# Patient Record
Sex: Female | Born: 1940 | Race: Asian | Hispanic: No | Smoking: Former smoker
Health system: Southern US, Community
[De-identification: ages and names within clinical notes are randomized; demographics above are authoritative.]

## PROBLEM LIST (undated history)

## (undated) DIAGNOSIS — C539 Malignant neoplasm of cervix uteri, unspecified: Secondary | ICD-10-CM

## (undated) HISTORY — PX: BREAST LUMPECTOMY: SHX2

## (undated) HISTORY — DX: Malignant neoplasm of cervix uteri, unspecified: C53.9

---

## 2009-01-15 ENCOUNTER — Ambulatory Visit: Payer: Self-pay | Admitting: Internal Medicine

## 2009-02-17 ENCOUNTER — Ambulatory Visit: Payer: Self-pay | Admitting: Family Medicine

## 2009-03-08 ENCOUNTER — Emergency Department: Payer: Self-pay | Admitting: Emergency Medicine

## 2010-12-11 DIAGNOSIS — I10 Essential (primary) hypertension: Secondary | ICD-10-CM | POA: Insufficient documentation

## 2012-09-03 ENCOUNTER — Ambulatory Visit: Payer: Self-pay | Admitting: Family Medicine

## 2013-01-14 DIAGNOSIS — E782 Mixed hyperlipidemia: Secondary | ICD-10-CM | POA: Insufficient documentation

## 2013-02-09 ENCOUNTER — Ambulatory Visit: Payer: Self-pay | Admitting: Physician Assistant

## 2013-02-09 LAB — URINALYSIS, COMPLETE
Glucose,UR: NEGATIVE mg/dL (ref 0–75)
Nitrite: POSITIVE
Protein: 100

## 2013-02-11 LAB — URINE CULTURE

## 2014-04-26 ENCOUNTER — Emergency Department: Payer: Self-pay | Admitting: Emergency Medicine

## 2016-10-23 DIAGNOSIS — Z170 Estrogen receptor positive status [ER+]: Secondary | ICD-10-CM | POA: Insufficient documentation

## 2016-10-23 DIAGNOSIS — Z853 Personal history of malignant neoplasm of breast: Secondary | ICD-10-CM

## 2017-04-03 DIAGNOSIS — H4089 Other specified glaucoma: Secondary | ICD-10-CM | POA: Insufficient documentation

## 2017-10-09 DIAGNOSIS — Z79811 Long term (current) use of aromatase inhibitors: Secondary | ICD-10-CM | POA: Insufficient documentation

## 2018-01-27 DIAGNOSIS — H34812 Central retinal vein occlusion, left eye, with macular edema: Secondary | ICD-10-CM | POA: Insufficient documentation

## 2018-04-04 DIAGNOSIS — R9431 Abnormal electrocardiogram [ECG] [EKG]: Secondary | ICD-10-CM | POA: Insufficient documentation

## 2019-03-06 DIAGNOSIS — N184 Chronic kidney disease, stage 4 (severe): Secondary | ICD-10-CM | POA: Insufficient documentation

## 2019-06-26 ENCOUNTER — Other Ambulatory Visit: Payer: Self-pay | Admitting: Nephrology

## 2019-06-26 DIAGNOSIS — N184 Chronic kidney disease, stage 4 (severe): Secondary | ICD-10-CM

## 2019-07-03 ENCOUNTER — Encounter (INDEPENDENT_AMBULATORY_CARE_PROVIDER_SITE_OTHER): Payer: Self-pay

## 2019-07-03 ENCOUNTER — Ambulatory Visit
Admission: RE | Admit: 2019-07-03 | Discharge: 2019-07-03 | Disposition: A | Discharge status: Home / Self Care | Payer: Medicare Other | Source: Ambulatory Visit | Attending: Nephrology | Admitting: Nephrology

## 2019-07-03 ENCOUNTER — Other Ambulatory Visit: Payer: Self-pay

## 2019-07-03 DIAGNOSIS — N184 Chronic kidney disease, stage 4 (severe): Secondary | ICD-10-CM

## 2019-08-18 NOTE — Progress Notes (Signed)
[facility]  [address] [place], [place] [address] Phone: [phone]  Fax: [phone]   Clinic Day:  08/19/2019  Referring physician: Anthonette Legato, MD  Chief Complaint: Kelly Pugh is a 79 y.o. female with stage IV chronic disease and an abnormal SPEP and UPEP who is referred in consultation by Dr. Anthonette Legato for assessment and management.   HPI: The patient has stage IV chronic kidney disease, anemia, and a monoclonal gammopathy.  She was initially seen by Dr Holley Raring in consultation on 06/25/2019.  Creatinine was 2.2 on 10/13/2018 and 2.1 on 04/03/2019.  GFR was 24-25 ml/min.  Risk factors for CKD included age, hypertension, and possible NSAID use.  Renal ultrasound on 07/03/2019 revealed diffusely increased renal cortical echogenicity compatible with medical renal disease. There were small simple appearing cysts in both kidneys.  SPEP on 06/25/2019 revealed a restricted band (M-spike) migrating in the gamma globulin region. UPEP on 06/25/2019 revealed two abnormal protein bands in the gamma globulins that may represent monoclonal immunoglobulins and/or light chains.   CBC has been followed: 10/13/2018: Hematocrit 32.2, hemoglobin 10.2, MCV 87.0, platelets 282,000, WBC 6,500. 04/03/2019: Hematocrit 34.8, hemoglobin 10.9, MCV 90.0, platelets 327,000, WBC 8,300. 06/25/2019: Hematocrit 32.4, hemoglobin 10.5, MCV 83.3, platelets 303,000, WBC 7,800. 08/05/2019: Hematocrit 34.2, hemoglobin 10.9, MCV 83.2, platelets 342,000, WBC 7,300.  Iron studies have been followed: 10/13/2018: Ferritin 214. Iron saturation 23%. TIBC 249. 04/03/2019: Ferritin 170  06/25/2019: Ferritin 160. Iron saturation 16%. TIBC 263.  Symptomatically, she feels "good."  She denies B symptoms, headaches, changes in vision, runny nose, sore throat, cough, shortness of breath, chest pain, palpitations, or abdominal symptoms. The patient's daughter says that she does not  have very good short term memory. She received both of her COVID vaccines a couple of months ago.  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 She had a lumpectomy and radiation therapy (12/31/2016-01/30/2017).  Oncotype DX score was 6.  Chemotherapy was not recommended. She has been on letrozole since 02/15/2017.  She is being followed by Dr. Genevieve Norlander at [facility].  The patient has a remote history of stage IIIC ovarian cancer.  She was diagnosed and treated by Kelly Pugh in 2005.  According to the patient's daughter, she has never had genetic testing.  The patient also has a history of central retinal vein occlusion. When asked about this and her history of cancer, the patient did not remember any of it happening.  One of the patient's daughter's had lymphoma in 2014 and the other had renal cell carcinoma and had a kidney removed.   Past Medical History:  Diagnosis Date  . Cervical cancer [facility])     Past Surgical History:  Procedure Laterality Date  . BREAST LUMPECTOMY      History reviewed. No pertinent family history.  Social History:  reports that she has quit smoking. Her smoking use included cigarettes. She has never used smokeless tobacco. She reports that she does not drink alcohol and does not use drugs. She denies alcohol and tobacco use. She worked at [employer]. Denies exposure to radiation or toxins. The patient lives in [place] with her two daughters. The patient is accompanied by her daughter, Kelly Pugh, today.  Allergies:  Allergies  Allergen Reactions  . Hydrocodone-Acetaminophen Nausea Only  . Oxycodone-Acetaminophen Nausea Only    Current Medications: Current Outpatient Medications  Medication Sig Dispense Refill  . albuterol (VENTOLIN HFA) 108 (90  Base) MCG/ACT inhaler Inhale into the lungs.    Marland Kitchen amLODipine (NORVASC) 10 MG tablet Take by mouth.    Marland Kitchen atorvastatin (LIPITOR) 10 MG tablet Take by  mouth.    . brimonidine (ALPHAGAN P) 0.1 % SOLN Apply to eye.    . dorzolamide-timolol (COSOPT) 22.3-6.8 MG/ML ophthalmic solution INSTILL 1 DROP INTO EACH EYE TWICE DAILY    . fluticasone (FLOVENT HFA) 110 MCG/ACT inhaler Inhale into the lungs.    . hydrochlorothiazide (HYDRODIURIL) 12.5 MG tablet Frequency:SEEESIG   Dosage:0.0     Instructions:  Note:Dose: UNKNOWN    . latanoprost (XALATAN) 0.005 % ophthalmic solution Apply to eye.    Marland Kitchen letrozole (FEMARA) 2.5 MG tablet Take by mouth.    . losartan (COZAAR) 25 MG tablet Take by mouth.    Marland Kitchen aspirin 81 MG chewable tablet Frequency:OTHER   Dosage:0.0     Instructions:  Note:Dose: UNKNOWN (Patient not taking: Reported on 08/19/2019)    . glimepiride (AMARYL) 1 MG tablet  (Patient not taking: Reported on 08/19/2019)     No current facility-administered medications for this visit.    Review of Systems  Constitutional: Negative for chills, diaphoresis, fever, malaise/fatigue and weight loss.       Feels "good."  HENT: Negative for congestion, ear discharge, ear pain, hearing loss, nosebleeds, sinus pain, sore throat and tinnitus.   Eyes: Negative for blurred vision.  Respiratory: Negative for cough, hemoptysis, sputum production and shortness of breath.   Cardiovascular: Negative for chest pain, palpitations and leg swelling.  Gastrointestinal: Negative for abdominal pain, blood in stool, constipation, diarrhea, heartburn, melena, nausea and vomiting.  Genitourinary: Negative for dysuria, frequency, hematuria and urgency.  Musculoskeletal: Negative for back pain, joint pain, myalgias and neck pain.  Skin: Negative for itching and rash.  Neurological: Negative for dizziness, tingling, sensory change, weakness and headaches.  Endo/Heme/Allergies: Does not bruise/bleed easily.  Psychiatric/Behavioral: Positive for memory loss. Negative for depression. The patient is not nervous/anxious and does not have insomnia.   All other systems reviewed and are  negative.  Performance status (ECOG): 1  Vitals Blood pressure 140/71, pulse 95, temperature 97.6 F (36.4 C), temperature source Tympanic, resp. rate 18, weight 138 lb 14.2 oz (63 kg), SpO2 100 %.   Physical Exam Vitals and nursing note reviewed.  Constitutional:      General: She is not in acute distress.    Appearance: She is not diaphoretic.  HENT:     Head: Normocephalic and atraumatic.     Comments: Short gray hair.    Mouth/Throat:     Mouth: Mucous membranes are moist.     Pharynx: Oropharynx is clear.  Eyes:     General: No scleral icterus.    Extraocular Movements: Extraocular movements intact.     Conjunctiva/sclera: Conjunctivae normal.     Pupils: Pupils are equal, round, and reactive to light.  Cardiovascular:     Rate and Rhythm: Normal rate and regular rhythm.     Pulses: Normal pulses.     Heart sounds: Normal heart sounds. No murmur heard.   Pulmonary:     Effort: Pulmonary effort is normal. No respiratory distress.     Breath sounds: Normal breath sounds. No wheezing or rales.  Chest:     Chest wall: No tenderness.  Abdominal:     General: Bowel sounds are normal. There is no distension.     Palpations: Abdomen is soft. There is no mass.     Tenderness: There is no abdominal  tenderness. There is no guarding or rebound.  Musculoskeletal:        General: No swelling or tenderness. Normal range of motion.     Cervical back: Normal range of motion and neck supple.  Lymphadenopathy:     Head:     Right side of head: No preauricular, posterior auricular or occipital adenopathy.     Left side of head: No preauricular, posterior auricular or occipital adenopathy.     Cervical: No cervical adenopathy.     Upper Body:     Right upper body: No supraclavicular or axillary adenopathy.     Left upper body: No supraclavicular or axillary adenopathy.     Lower Body: No right inguinal adenopathy. No left inguinal adenopathy.  Skin:    General: Skin is warm and  dry.  Neurological:     Mental Status: She is alert and oriented to person, place, and time. Mental status is at baseline.  Psychiatric:        Mood and Affect: Mood normal.        Behavior: Behavior normal.        Thought Content: Thought content normal.        Judgment: Judgment normal.     No visits with results within 3 Day(s) from this visit.  Latest known visit with results is:  No results found for any previous visit.    Assessment:  LEQUITA MEADOWCROFT is a 79 y.o. female with stage IV chronic kidney disease and a monoclonal gammopathy.    SPEP on 06/25/2019 revealed a restricted band (M-spike) migrating in the gamma globulin region.  UPEP on 06/25/2019 revealed two abnormal protein bands in  the gamma globulins that may represent monoclonal immunoglobulins and/or light chains.   She has a normocytic anemia.  Hematocrit was 34.2, hemoglobin 10.9, MCV 83.2, platelets 342,000, WBC 7,300 on 08/05/2019.  Ferritin was 160 with an iron saturation of 16% and a TIBC 263 on 06/25/2019.  She has a history of stage IA (T1bN0M0) ER/PR+, HER2- right breast cancer s/p lumpectomy on 11/09/2016 followed by radiation (12/31/2016 - 01/30/2017).  Oncotype DX score was 6.  Chemotherapy was not recommended. She has been on letrozole since 02/15/2017.  She has a remote history of stage IIIC ovarian cancer s/p TAH BSO, partial omentectomy and radical resection in 09/2003.  She was diagnosed and treated by [facility].  She has never had genetic testing.  She received both of her COVID vaccines a couple of months ago.  Symptomatically, she feels good.  She denies any fevers, sweats or weight loss.  She denies any bone pain or issues with infections.  Exam reveals no adenopath or hepatosplenomegaly.  Plan: 1.   Labs today:  CBC with diff, BMP, LDH, beta2-microglobulin, myeloma panel, free light chain assay. 2.   24 hour urine for UPEP and free light chains 3.   Abnormal SPEP and  UPEP  Etiology is unclear.  Discuss differential diagnosis including monoclonal gammopathy of unknown significance (MGUS) and a lymphoplasmacytic disorder.  Discuss sending additional labs and 24 hour urine for testing. 4.   RTC in 2 weeks for MD assessment, review of work-up and discussion regarding direction of therapy.  I discussed the assessment and treatment plan with the patient.  The patient was provided an opportunity to ask questions and all were answered.  The patient agreed with the plan and demonstrated an understanding of the instructions.  The patient was advised to call back if the symptoms worsen or  if the condition fails to improve as anticipated.  I provided 22 minutes of face-to-face time during this encounter and > 50% was spent counseling as documented under my assessment and plan.  An additional 20 minutes were spent reviewing her chart (Epic and Care Everywhere) including notes, labs, and imaging studies.    Chanise Habeck C. Mike Gip, MD, PhD    08/19/2019, 3:16 PM  I, Mirian Mo Tufford, am acting as Education administrator for [employer]. Mike Gip, MD, PhD.  I, Josselin Gaulin C. Mike Gip, MD, have reviewed the above documentation for accuracy and completeness, and I agree with the above.

## 2019-08-19 ENCOUNTER — Inpatient Hospital Stay: Payer: Medicare Other

## 2019-08-19 ENCOUNTER — Inpatient Hospital Stay: Payer: Medicare Other | Attending: Hematology and Oncology | Admitting: Hematology and Oncology

## 2019-08-19 ENCOUNTER — Encounter: Payer: Self-pay | Admitting: Hematology and Oncology

## 2019-08-19 ENCOUNTER — Other Ambulatory Visit: Payer: Self-pay

## 2019-08-19 VITALS — BP 140/71 | HR 95 | Temp 97.6°F | Resp 18 | Wt 138.9 lb

## 2019-08-19 DIAGNOSIS — Z7984 Long term (current) use of oral hypoglycemic drugs: Secondary | ICD-10-CM | POA: Diagnosis not present

## 2019-08-19 DIAGNOSIS — Z8541 Personal history of malignant neoplasm of cervix uteri: Secondary | ICD-10-CM | POA: Insufficient documentation

## 2019-08-19 DIAGNOSIS — Z923 Personal history of irradiation: Secondary | ICD-10-CM | POA: Diagnosis not present

## 2019-08-19 DIAGNOSIS — Z7982 Long term (current) use of aspirin: Secondary | ICD-10-CM | POA: Insufficient documentation

## 2019-08-19 DIAGNOSIS — Z90710 Acquired absence of both cervix and uterus: Secondary | ICD-10-CM | POA: Insufficient documentation

## 2019-08-19 DIAGNOSIS — Z7951 Long term (current) use of inhaled steroids: Secondary | ICD-10-CM | POA: Insufficient documentation

## 2019-08-19 DIAGNOSIS — Z79811 Long term (current) use of aromatase inhibitors: Secondary | ICD-10-CM | POA: Diagnosis not present

## 2019-08-19 DIAGNOSIS — D472 Monoclonal gammopathy: Secondary | ICD-10-CM | POA: Diagnosis present

## 2019-08-19 DIAGNOSIS — C50911 Malignant neoplasm of unspecified site of right female breast: Secondary | ICD-10-CM | POA: Diagnosis not present

## 2019-08-19 DIAGNOSIS — Z807 Family history of other malignant neoplasms of lymphoid, hematopoietic and related tissues: Secondary | ICD-10-CM | POA: Insufficient documentation

## 2019-08-19 DIAGNOSIS — R778 Other specified abnormalities of plasma proteins: Secondary | ICD-10-CM

## 2019-08-19 DIAGNOSIS — Z8051 Family history of malignant neoplasm of kidney: Secondary | ICD-10-CM | POA: Insufficient documentation

## 2019-08-19 DIAGNOSIS — Z170 Estrogen receptor positive status [ER+]: Secondary | ICD-10-CM | POA: Insufficient documentation

## 2019-08-19 DIAGNOSIS — E119 Type 2 diabetes mellitus without complications: Secondary | ICD-10-CM | POA: Insufficient documentation

## 2019-08-19 DIAGNOSIS — Z9079 Acquired absence of other genital organ(s): Secondary | ICD-10-CM | POA: Diagnosis not present

## 2019-08-19 DIAGNOSIS — D649 Anemia, unspecified: Secondary | ICD-10-CM | POA: Insufficient documentation

## 2019-08-19 DIAGNOSIS — N184 Chronic kidney disease, stage 4 (severe): Secondary | ICD-10-CM | POA: Insufficient documentation

## 2019-08-19 DIAGNOSIS — Z90722 Acquired absence of ovaries, bilateral: Secondary | ICD-10-CM | POA: Diagnosis not present

## 2019-08-19 DIAGNOSIS — Z79899 Other long term (current) drug therapy: Secondary | ICD-10-CM | POA: Diagnosis not present

## 2019-08-19 DIAGNOSIS — Z87891 Personal history of nicotine dependence: Secondary | ICD-10-CM | POA: Insufficient documentation

## 2019-08-19 LAB — CBC WITH DIFFERENTIAL/PLATELET
Abs Immature Granulocytes: 0.02 10*3/uL (ref 0.00–0.07)
Basophils Absolute: 0 10*3/uL (ref 0.0–0.1)
Basophils Relative: 1 %
Eosinophils Absolute: 0.2 10*3/uL (ref 0.0–0.5)
Eosinophils Relative: 3 %
HCT: 30.3 % — ABNORMAL LOW (ref 36.0–46.0)
Hemoglobin: 9.9 g/dL — ABNORMAL LOW (ref 12.0–15.0)
Immature Granulocytes: 0 %
Lymphocytes Relative: 33 %
Lymphs Abs: 2 10*3/uL (ref 0.7–4.0)
MCH: 27.2 pg (ref 26.0–34.0)
MCHC: 32.7 g/dL (ref 30.0–36.0)
MCV: 83.2 fL (ref 80.0–100.0)
Monocytes Absolute: 0.4 10*3/uL (ref 0.1–1.0)
Monocytes Relative: 7 %
Neutro Abs: 3.4 10*3/uL (ref 1.7–7.7)
Neutrophils Relative %: 56 %
Platelets: 303 10*3/uL (ref 150–400)
RBC: 3.64 MIL/uL — ABNORMAL LOW (ref 3.87–5.11)
RDW: 14 % (ref 11.5–15.5)
WBC: 6.1 10*3/uL (ref 4.0–10.5)
nRBC: 0 % (ref 0.0–0.2)

## 2019-08-19 LAB — BASIC METABOLIC PANEL
Anion gap: 9 (ref 5–15)
BUN: 20 mg/dL (ref 8–23)
CO2: 26 mmol/L (ref 22–32)
Calcium: 9 mg/dL (ref 8.9–10.3)
Chloride: 102 mmol/L (ref 98–111)
Creatinine, Ser: 2.01 mg/dL — ABNORMAL HIGH (ref 0.44–1.00)
GFR calc Af Amer: 26 mL/min — ABNORMAL LOW (ref >60)
GFR calc non Af Amer: 23 mL/min — ABNORMAL LOW (ref >60)
Glucose, Bld: 92 mg/dL (ref 70–99)
Potassium: 4.6 mmol/L (ref 3.5–5.1)
Sodium: 137 mmol/L (ref 135–145)

## 2019-08-19 LAB — LACTATE DEHYDROGENASE: LDH: 137 U/L (ref 98–192)

## 2019-08-19 NOTE — Progress Notes (Signed)
One fall out of the shower recently, no injuries. Sleep a lot during the day.

## 2019-08-20 LAB — KAPPA/LAMBDA LIGHT CHAINS
Kappa free light chain: 117.5 mg/L — ABNORMAL HIGH (ref 3.3–19.4)
Kappa, lambda light chain ratio: 3.11 — ABNORMAL HIGH (ref 0.26–1.65)
Lambda free light chains: 37.8 mg/L — ABNORMAL HIGH (ref 5.7–26.3)

## 2019-08-20 LAB — BETA 2 MICROGLOBULIN, SERUM: Beta-2 Microglobulin: 3.6 mg/L — ABNORMAL HIGH (ref 0.6–2.4)

## 2019-08-21 LAB — MULTIPLE MYELOMA PANEL, SERUM
Albumin SerPl Elph-Mcnc: 3.2 g/dL (ref 2.9–4.4)
Albumin/Glob SerPl: 0.8 (ref 0.7–1.7)
Alpha 1: 0.3 g/dL (ref 0.0–0.4)
Alpha2 Glob SerPl Elph-Mcnc: 1 g/dL (ref 0.4–1.0)
B-Globulin SerPl Elph-Mcnc: 1 g/dL (ref 0.7–1.3)
Gamma Glob SerPl Elph-Mcnc: 1.8 g/dL (ref 0.4–1.8)
Globulin, Total: 4.1 g/dL — ABNORMAL HIGH (ref 2.2–3.9)
IgA: 307 mg/dL (ref 64–422)
IgG (Immunoglobin G), Serum: 1991 mg/dL — ABNORMAL HIGH (ref 586–1602)
IgM (Immunoglobulin M), Srm: 21 mg/dL — ABNORMAL LOW (ref 26–217)
M Protein SerPl Elph-Mcnc: 1.2 g/dL — ABNORMAL HIGH
Total Protein ELP: 7.3 g/dL (ref 6.0–8.5)

## 2019-08-24 NOTE — Addendum Note (Signed)
Addended by: Nolon Stalls C on: 08/24/2019 10:29 PM   Modules accepted: Level of Service

## 2019-09-06 NOTE — Progress Notes (Signed)
[facility]  [address] [place], [place] [address] Phone: [phone]  Fax: [phone]   Clinic Day:  09/07/2019  Referring physician: Ulyess Blossom, PA  Chief Complaint: Kelly Pugh is a 79 y.o. female with stage IV chronic kidney disease and a monoclonal gammopathy who is seen for review of work up and discussion regarding direction of therapy   HPI: The patient was last seen in the medical oncology clinic on 08/19/2019 for an initial consult.  At that time, she felt good. She denied any fevers, sweats or weight loss. She denied any bone pain or issues with infections.  Exam revealed no adenopathy or hepatosplenomegaly.   Work up included hematocrit 30.3, hemoglobin 9.9, platelets 303,000, WBC 6,100. Creatinine was 2.01 and calcium 9.0.  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  IgG was 1,991. Kappa free light chains were 117.5,  lambda free light chains 37.8 and ratio 3.11 (0.26-1.65).  Beta-2 microglobulin was 3.6 (0.6-2.4) . LDH was 137 (98-192).   24 hour urine for UPEP and free light chains is pending.  During the interim, the patient has felt "good".  Kelly Pugh, her daughter, reports that the 24 hour urine did not work out. Kelly Pugh states her mother was not using the hat properly. She feels like her mother would forget to use the hat due to her short term memory loss.  She declines repeat testing.  Patient and daughter agreed to a bone marrow biopsy and bone survey to r/o myeloma.    Past Medical History:  Diagnosis Date  . Cervical cancer [facility])     Past Surgical History:  Procedure Laterality Date  . BREAST LUMPECTOMY      History reviewed. No pertinent family history.  Social History:  reports that she has quit smoking. Her smoking use included cigarettes. She has never used smokeless tobacco. She reports that she does not drink alcohol and does not use drugs. She denies alcohol  and tobacco use. She worked at [employer]. Denies exposure to radiation or toxins. The patient lives in [place] with her two daughters. The patient is accompanied by her daughter, Kelly Pugh, today via iPad.  Allergies:  Allergies  Allergen Reactions  . Hydrocodone-Acetaminophen Nausea Only  . Oxycodone-Acetaminophen Nausea Only    Current Medications: Current Outpatient Medications  Medication Sig Dispense Refill  . albuterol (VENTOLIN HFA) 108 (90 Base) MCG/ACT inhaler Inhale into the lungs.    Marland Kitchen amLODipine (NORVASC) 10 MG tablet Take by mouth.    Marland Kitchen atorvastatin (LIPITOR) 10 MG tablet Take by mouth.    . brimonidine (ALPHAGAN P) 0.1 % SOLN Apply to eye.    . dorzolamide-timolol (COSOPT) 22.3-6.8 MG/ML ophthalmic solution INSTILL 1 DROP INTO EACH EYE TWICE DAILY    . fluticasone (FLOVENT HFA) 110 MCG/ACT inhaler Inhale into the lungs.    . hydrochlorothiazide (HYDRODIURIL) 12.5 MG tablet Frequency:SEEESIG   Dosage:0.0     Instructions:  Note:Dose: UNKNOWN    . latanoprost (XALATAN) 0.005 % ophthalmic solution Apply to eye.    Marland Kitchen letrozole (FEMARA) 2.5 MG tablet Take by mouth.    . losartan (COZAAR) 25 MG tablet Take by mouth.    Marland Kitchen aspirin 81 MG chewable tablet Frequency:OTHER   Dosage:0.0     Instructions:  Note:Dose: UNKNOWN (Patient not taking: Reported on 08/19/2019)    . glimepiride (AMARYL) 1 MG tablet  (Patient not taking: Reported on 08/19/2019)     No current facility-administered medications  for this visit.    Review of Systems  Constitutional: Negative for chills, diaphoresis, fever, malaise/fatigue and weight loss (stable).       Feels "good".  HENT: Negative for congestion, ear discharge, ear pain, hearing loss, nosebleeds, sinus pain, sore throat and tinnitus.   Eyes: Negative for blurred vision.  Respiratory: Negative for cough, hemoptysis, sputum production and shortness of breath.   Cardiovascular: Negative for chest pain, palpitations and leg swelling.  Gastrointestinal:  Negative for abdominal pain, blood in stool, constipation, diarrhea, heartburn, melena, nausea and vomiting.  Genitourinary: Negative for dysuria, frequency, hematuria and urgency.  Musculoskeletal: Negative for back pain, joint pain, myalgias and neck pain.  Skin: Negative for itching and rash.  Neurological: Negative for dizziness, tingling, sensory change, weakness and headaches.  Endo/Heme/Allergies: Does not bruise/bleed easily.  Psychiatric/Behavioral: Positive for memory loss (short term). Negative for depression. The patient is not nervous/anxious and does not have insomnia.   All other systems reviewed and are negative.  Performance status (ECOG): 1  Vitals Blood pressure 130/66, pulse 95, temperature 98.2 F (36.8 C), temperature source Tympanic, weight 138 lb 10.7 oz (62.9 kg), SpO2 99 %.   Physical Exam Vitals and nursing note reviewed.  Constitutional:      General: She is not in acute distress.    Appearance: She is not diaphoretic.     Interventions: Face mask in place.  HENT:     Head: Normocephalic and atraumatic.     Comments: Short gray hair. Eyes:     General: No scleral icterus.    Conjunctiva/sclera: Conjunctivae normal.  Neurological:     Mental Status: She is alert and oriented to person, place, and time. Mental status is at baseline.  Psychiatric:        Mood and Affect: Mood normal.        Behavior: Behavior normal.        Thought Content: Thought content normal.        Judgment: Judgment normal.    No visits with results within 3 Day(s) from this visit.  Latest known visit with results is:  Appointment on 08/19/2019  Component Date Value Ref Range Status  . Beta-2 Microglobulin 08/19/2019 3.6* 0.6 - 2.4 mg/L Final   Comment: (NOTE) Siemens Immulite 2000 Immunochemiluminometric assay (ICMA) Values obtained with different assay methods or kits cannot be used interchangeably. Results cannot be interpreted as absolute evidence of the presence or  absence of malignant disease. Performed At: [facility] [place], [place] [id number] Rush Farmer MD PJ:[id number]   . LDH 08/19/2019 137  98 - 192 U/L Final   Performed at [facility], [address], [place], [place] [address]  . Kappa free light chain 08/19/2019 117.5* 3.3 - 19.4 mg/L Final  . Lamda free light chains 08/19/2019 37.8* 5.7 - 26.3 mg/L Final  . Kappa, lamda light chain ratio 08/19/2019 3.11* 0.26 - 1.65 Final   Comment: (NOTE) Performed At: [facility] [place], [place] [id number] Rush Farmer MD OI:[id number]   . IgG (Immunoglobin G), Serum 08/19/2019 1,991* 586 - 1,602 mg/dL Final  . IgA 08/19/2019 307  64 - 422 mg/dL Final  . IgM (Immunoglobulin M), Srm 08/19/2019 21* 26 - 217 mg/dL Final   Result confirmed on concentration.  . Total Protein ELP 08/19/2019 7.3  6.0 - 8.5 g/dL Corrected  . Albumin SerPl Elph-Mcnc 08/19/2019 3.2  2.9 - 4.4 g/dL Corrected  . Alpha 1 08/19/2019 0.3  0.0 -  0.4 g/dL Corrected  . Alpha2 Glob SerPl Elph-Mcnc 08/19/2019 1.0  0.4 - 1.0 g/dL Corrected  . B-Globulin SerPl Elph-Mcnc 08/19/2019 1.0  0.7 - 1.3 g/dL Corrected  . Gamma Glob SerPl Elph-Mcnc 08/19/2019 1.8  0.4 - 1.8 g/dL Corrected  . M Protein SerPl Elph-Mcnc 08/19/2019 1.2* Not Observed g/dL Corrected  . Globulin, Total 08/19/2019 4.1* 2.2 - 3.9 g/dL Corrected  . Albumin/Glob SerPl 08/19/2019 0.8  0.7 - 1.7 Corrected  . IFE 1 08/19/2019 Comment*  Corrected   Comment: (NOTE) Immunofixation shows IgG monoclonal protein with kappa light chain specificity. Please note that samples from patients receiving DARZALEX(R) (daratumumab) treatment can appear as an "IgG kappa" and mask a complete response. If this patient is receiving DARA, this IFE assay interference can be removed by ordering test number [id number]-"Immunofixation, Daratumumab-Specific, Serum" and submitting a new sample for testing or by calling the lab to add  this test to the current sample.   . Please Note 08/19/2019 Comment   Corrected   Comment: (NOTE) Protein electrophoresis scan will follow via computer, mail, or courier delivery. Performed At: [facility] [place], [place] [id number] Rush Farmer MD [id number]   . Sodium 08/19/2019 137  135 - 145 mmol/L Final  . Potassium 08/19/2019 4.6  3.5 - 5.1 mmol/L Final  . Chloride 08/19/2019 102  98 - 111 mmol/L Final  . CO2 08/19/2019 26  22 - 32 mmol/L Final  . Glucose, Bld 08/19/2019 92  70 - 99 mg/dL Final   Glucose reference range applies only to samples taken after fasting for at least 8 hours.  . BUN 08/19/2019 20  8 - 23 mg/dL Final  . Creatinine, Ser 08/19/2019 2.01* 0.44 - 1.00 mg/dL Final  . Calcium 08/19/2019 9.0  8.9 - 10.3 mg/dL Final  . GFR calc non Af Amer 08/19/2019 23* >60 mL/min Final  . GFR calc Af Amer 08/19/2019 26* >60 mL/min Final  . Anion gap 08/19/2019 9  5 - 15 Final   Performed at [facility], [address] [place], [place] [address]  . WBC 08/19/2019 6.1  4.0 - 10.5 K/uL Final  . RBC 08/19/2019 3.64* 3.87 - 5.11 MIL/uL Final  . Hemoglobin 08/19/2019 9.9* 12.0 - 15.0 g/dL Final  . HCT 08/19/2019 30.3* 36 - 46 % Final  . MCV 08/19/2019 83.2  80.0 - 100.0 fL Final  . MCH 08/19/2019 27.2  26.0 - 34.0 pg Final  . MCHC 08/19/2019 32.7  30.0 - 36.0 g/dL Final  . RDW 08/19/2019 14.0  11.5 - 15.5 % Final  . Platelets 08/19/2019 303  150 - 400 K/uL Final  . nRBC 08/19/2019 0.0  0.0 - 0.2 % Final  . Neutrophils Relative % 08/19/2019 56  % Final  . Neutro Abs 08/19/2019 3.4  1.7 - 7.7 K/uL Final  . Lymphocytes Relative 08/19/2019 33  % Final  . Lymphs Abs 08/19/2019 2.0  0.7 - 4.0 K/uL Final  . Monocytes Relative 08/19/2019 7  % Final  . Monocytes Absolute 08/19/2019 0.4  0 - 1 K/uL Final  . Eosinophils Relative 08/19/2019 3  % Final  . Eosinophils Absolute 08/19/2019 0.2  0 - 0 K/uL Final  . Basophils Relative 08/19/2019  1  % Final  . Basophils Absolute 08/19/2019 0.0  0 - 0 K/uL Final  . Immature Granulocytes 08/19/2019 0  % Final  . Abs Immature Granulocytes 08/19/2019 0.02  0.00 - 0.07 K/uL Final   Performed at [facility], [address] [place], [place] [address]    Assessment:  KALEN NEIDERT is a 79 y.o. female with stage IV chronic kidney disease and a monoclonal gammopathy.    SPEP on 06/25/2019 revealed a restricted band (M-spike) migrating in the gamma globulin region.  UPEP on 06/25/2019 revealed two abnormal protein bands in  the gamma globulins that may represent monoclonal immunoglobulins and/or light chains.   Work up on 08/19/2019 included hematocrit 30.3, hemoglobin 9.9, platelets 303,000, WBC 6,100. Creatinine was 2.01 and calcium 9.0.  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  IgG was 1,991. Kappa free light chains were 117.5,  lambda free light chains 37.8 and ratio 3.11 (0.26-1.65).  Beta-2 microglobulin was 3.6 (0.6-2.4) . LDH 137 (98-192).   24 hour urine for UPEP and free light chains was unable to be obtained.  She has a normocytic anemia.  Hematocrit was 34.2, hemoglobin 10.9, MCV 83.2, platelets 342,000, WBC 7,300 on 08/05/2019.  Ferritin was 160 with an iron saturation of 16% and a TIBC 263 on 06/25/2019.  She has a history of stage IA (T1bN0M0) ER/PR+, HER2- right breast cancer s/p lumpectomy on 11/09/2016 followed by radiation (12/31/2016 - 01/30/2017).  Oncotype DX score was 6.  Chemotherapy was not recommended. She has been on letrozole since 02/15/2017.  She has a remote history of stage IIIC ovarian cancer s/p TAH BSO, partial omentectomy and radical resection in 09/2003.  She was diagnosed and treated by [facility].  She has never had genetic testing.  She received both of her COVID vaccines a couple of months ago.  Symptomatically, she feels good.  She has short term memory loss and is unable to  obtain a 24 hour urine.  Hemoglobin is 9.9.  Creatinine is 2.01.  Plan: 1.   Review work up. 2.   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  Kappa light chains are 117.5 with a ratio of 3.11.  Beta-2 microglobulin is 3.6.  She has a normocytic anemia and renal insufficiency.  Calcium is normal.  Discuss obtaining a bone survey to assess for lytic lesions.  She is unable to obtain a 24-hour urine.  Discuss consideration of a bone marrow aspirate and biopsy.  The procedure was discussed in detail.  Risk and benefits were reviewed.   The patient and her daughter agreed to bone marrow biopsy. 3.   Bone survey today. 4.   Bone marrow aspirate and biopsy. 5.   RTC 10 days after bone marrow for MD assessment and review of work-up and discussion regarding direction of therapy.  I discussed the assessment and treatment plan with the patient.  The patient was provided an opportunity to ask questions and all were answered.  The patient agreed with the plan and demonstrated an understanding of the instructions.  The patient was advised to call back if the symptoms worsen or if the condition fails to improve as anticipated.   Melissa C. Mike Gip, MD, PhD    09/07/2019, 3:47 PM  I, Selena Batten, am acting as scribe for [employer]. Mike Gip, MD, PhD.  I, Melissa C. Mike Gip, MD, have reviewed the above documentation for accuracy and completeness, and I agree with the above.

## 2019-09-07 ENCOUNTER — Other Ambulatory Visit: Payer: Self-pay

## 2019-09-07 ENCOUNTER — Ambulatory Visit
Admission: RE | Admit: 2019-09-07 | Discharge: 2019-09-07 | Disposition: A | Discharge status: Home / Self Care | Payer: Medicare Other | Source: Ambulatory Visit | Attending: Hematology and Oncology | Admitting: Hematology and Oncology

## 2019-09-07 ENCOUNTER — Inpatient Hospital Stay: Payer: Medicare Other | Attending: Hematology and Oncology | Admitting: Hematology and Oncology

## 2019-09-07 ENCOUNTER — Ambulatory Visit
Admission: RE | Admit: 2019-09-07 | Discharge: 2019-09-07 | Disposition: A | Discharge status: Home / Self Care | Payer: Medicare Other | Attending: Hematology and Oncology | Admitting: Hematology and Oncology

## 2019-09-07 ENCOUNTER — Encounter: Payer: Self-pay | Admitting: Hematology and Oncology

## 2019-09-07 VITALS — BP 130/66 | HR 95 | Temp 98.2°F | Wt 138.7 lb

## 2019-09-07 DIAGNOSIS — D649 Anemia, unspecified: Secondary | ICD-10-CM | POA: Insufficient documentation

## 2019-09-07 DIAGNOSIS — D472 Monoclonal gammopathy: Secondary | ICD-10-CM | POA: Insufficient documentation

## 2019-09-07 DIAGNOSIS — C50911 Malignant neoplasm of unspecified site of right female breast: Secondary | ICD-10-CM | POA: Insufficient documentation

## 2019-09-07 DIAGNOSIS — N184 Chronic kidney disease, stage 4 (severe): Secondary | ICD-10-CM | POA: Diagnosis not present

## 2019-09-07 DIAGNOSIS — Z79899 Other long term (current) drug therapy: Secondary | ICD-10-CM | POA: Diagnosis not present

## 2019-09-07 DIAGNOSIS — Z7984 Long term (current) use of oral hypoglycemic drugs: Secondary | ICD-10-CM | POA: Diagnosis not present

## 2019-09-07 DIAGNOSIS — Z7982 Long term (current) use of aspirin: Secondary | ICD-10-CM | POA: Diagnosis not present

## 2019-09-07 DIAGNOSIS — Z170 Estrogen receptor positive status [ER+]: Secondary | ICD-10-CM | POA: Diagnosis not present

## 2019-09-07 DIAGNOSIS — Z923 Personal history of irradiation: Secondary | ICD-10-CM | POA: Diagnosis not present

## 2019-09-07 DIAGNOSIS — Z7951 Long term (current) use of inhaled steroids: Secondary | ICD-10-CM | POA: Diagnosis not present

## 2019-09-07 DIAGNOSIS — Z79811 Long term (current) use of aromatase inhibitors: Secondary | ICD-10-CM | POA: Insufficient documentation

## 2019-09-07 DIAGNOSIS — Z90722 Acquired absence of ovaries, bilateral: Secondary | ICD-10-CM | POA: Insufficient documentation

## 2019-09-07 DIAGNOSIS — Z87891 Personal history of nicotine dependence: Secondary | ICD-10-CM | POA: Insufficient documentation

## 2019-09-07 DIAGNOSIS — Z8541 Personal history of malignant neoplasm of cervix uteri: Secondary | ICD-10-CM | POA: Insufficient documentation

## 2019-11-16 ENCOUNTER — Other Ambulatory Visit: Payer: Self-pay

## 2019-11-16 ENCOUNTER — Other Ambulatory Visit: Payer: Self-pay | Admitting: Physician Assistant

## 2019-11-25 ENCOUNTER — Inpatient Hospital Stay
Admission: RE | Admit: 2019-11-25 | Discharge: 2019-11-25 | Disposition: A | Discharge status: Home / Self Care | Payer: Self-pay | Source: Ambulatory Visit | Attending: *Deleted | Admitting: *Deleted

## 2019-11-25 ENCOUNTER — Other Ambulatory Visit: Payer: Self-pay | Admitting: *Deleted

## 2019-11-25 DIAGNOSIS — Z1231 Encounter for screening mammogram for malignant neoplasm of breast: Secondary | ICD-10-CM

## 2019-12-09 DIAGNOSIS — F0390 Unspecified dementia without behavioral disturbance: Secondary | ICD-10-CM | POA: Diagnosis present

## 2019-12-10 ENCOUNTER — Telehealth: Payer: Self-pay | Admitting: *Deleted

## 2019-12-10 NOTE — Telephone Encounter (Signed)
  Let's refer her to the [place] clinic.  M

## 2019-12-10 NOTE — Telephone Encounter (Signed)
Daughter called asking for referral to [facility] for a BMBX

## 2019-12-11 ENCOUNTER — Encounter: Payer: Self-pay | Admitting: *Deleted

## 2020-05-14 IMAGING — US US RENAL
1 series · 14 of 25 positions shown · non-contrast
Comparison: None.

CLINICAL DATA: CKD stage 4

EXAM:
RENAL / URINARY TRACT ULTRASOUND COMPLETE

[Series 1: us renal · 0.22mm/px · 14 of 40 slices shown]
[im 1/40]
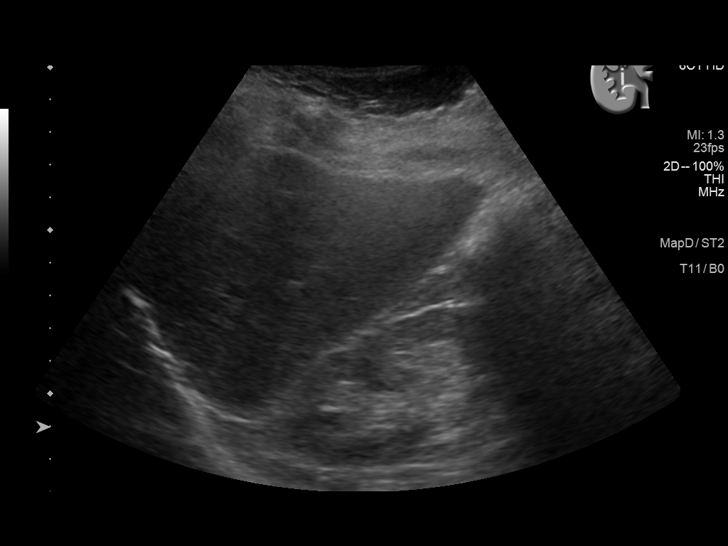
[im 4/40]
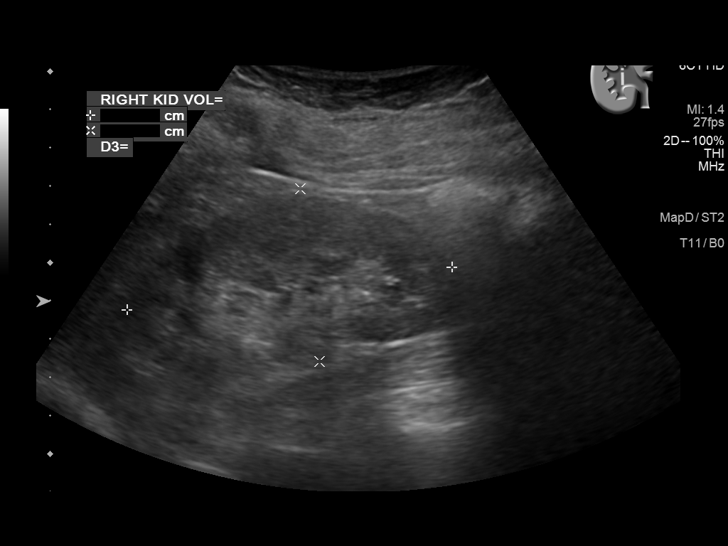
[im 7/40]
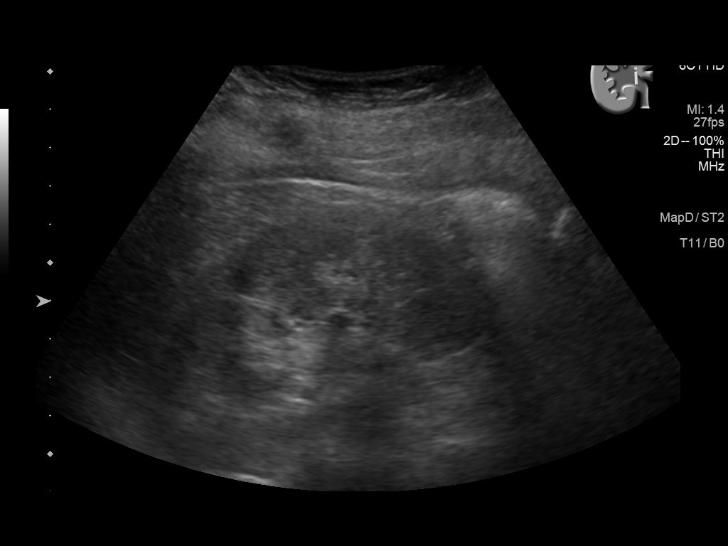
[im 10/40]
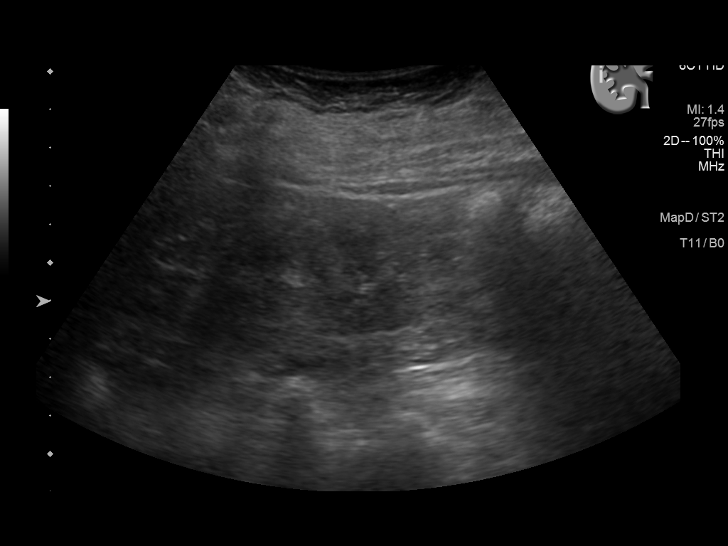
[im 14/40]
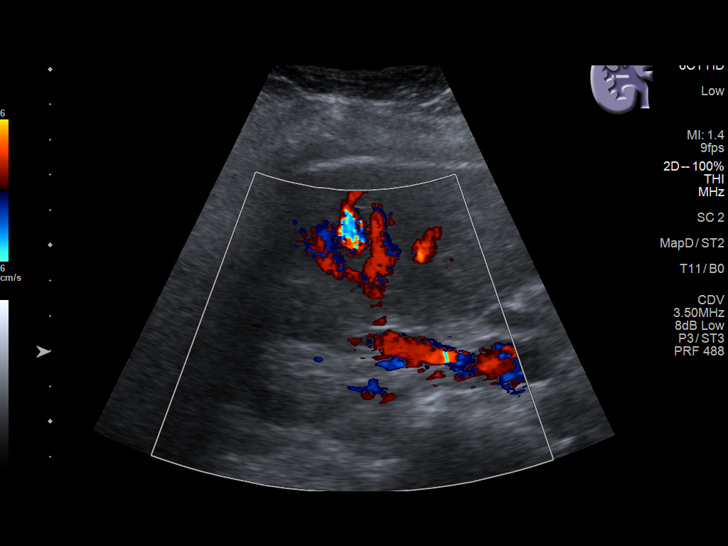
[im 15/40]
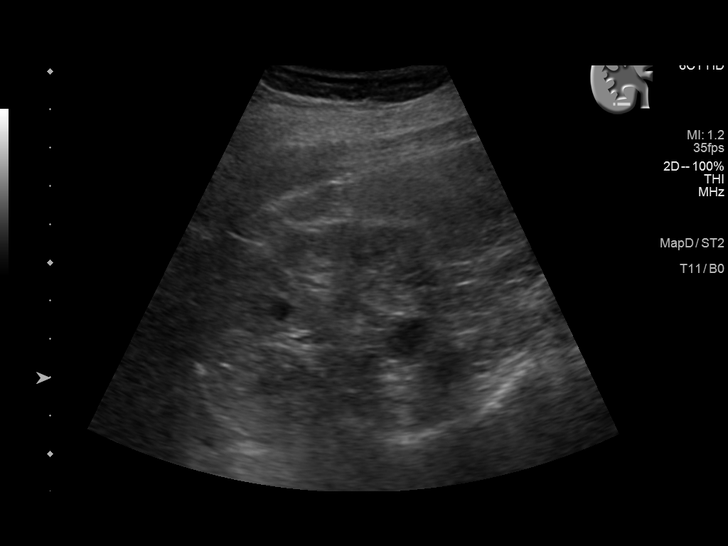
[im 18/40]
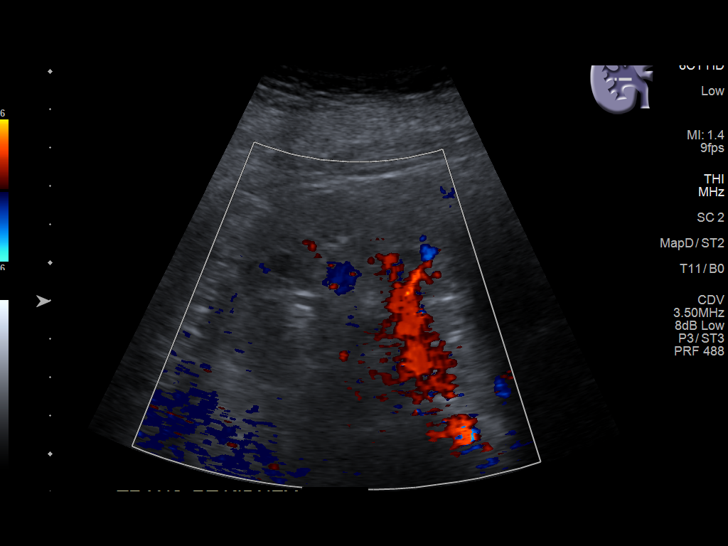
[im 22/40]
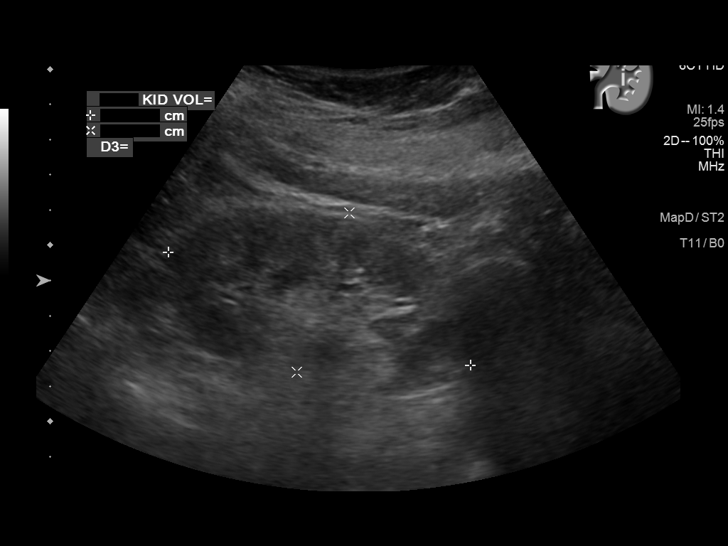
[im 25/40]
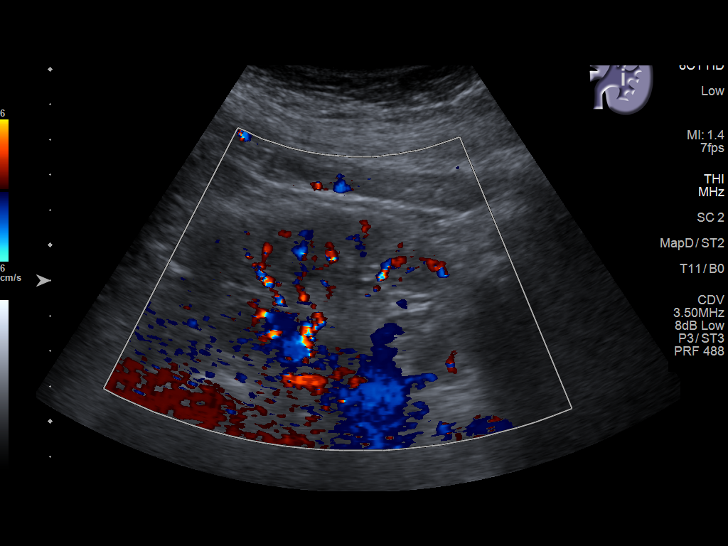
[im 27/40]
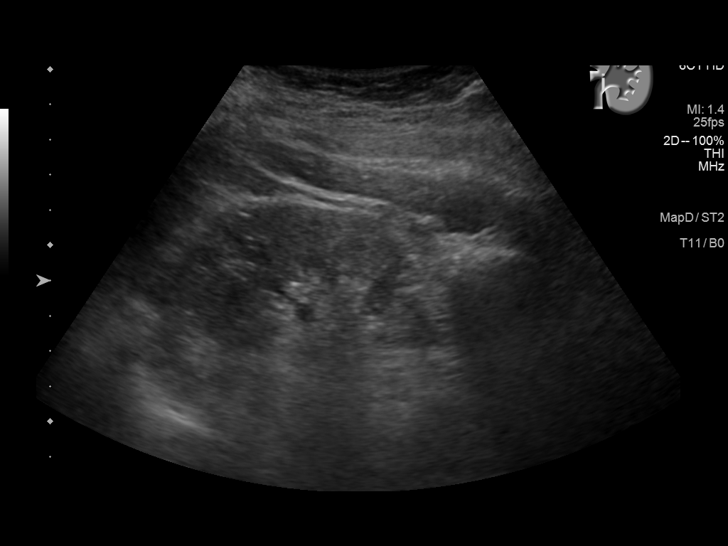
[im 30/40]
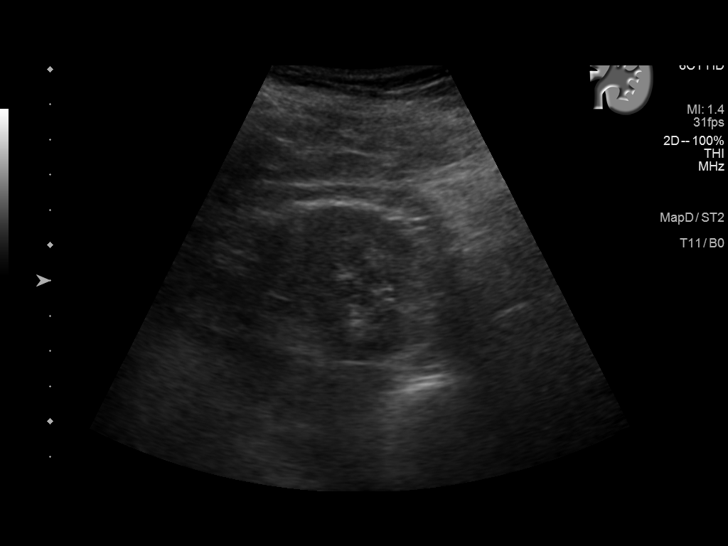
[im 33/40]
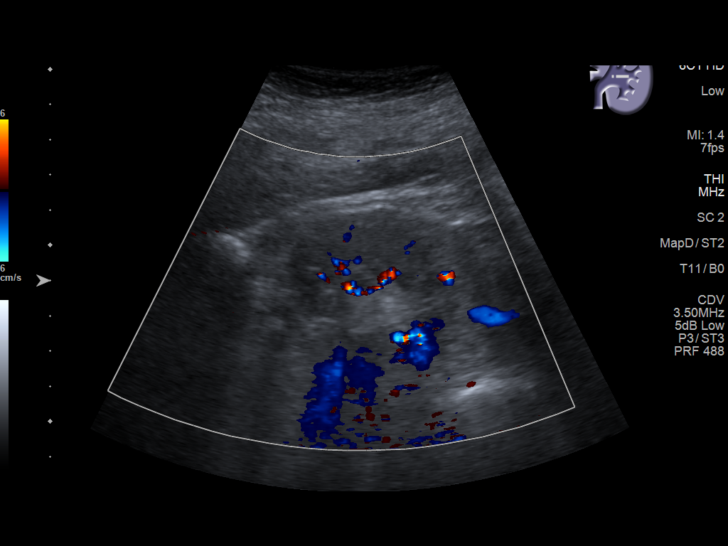
[im 36/40]
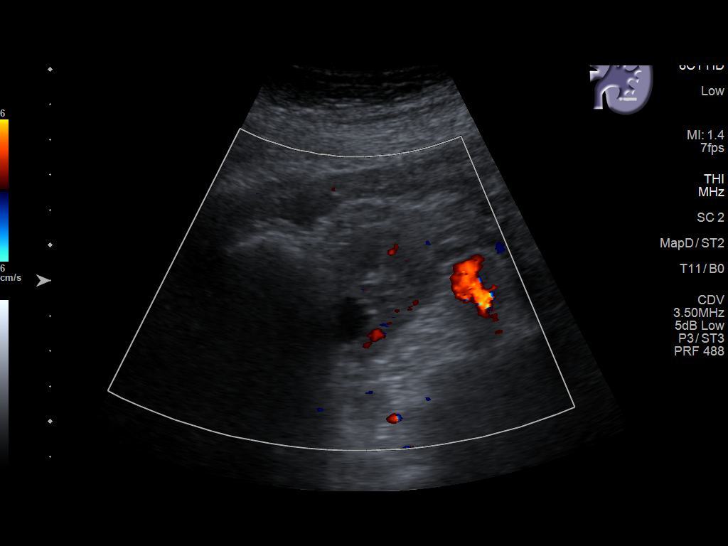
[im 40/40]
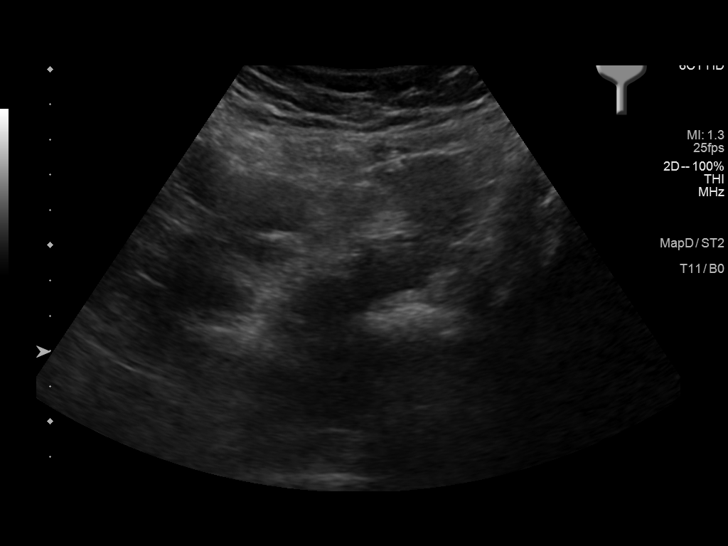

[14 of 25 positions shown; findings below may reference images not displayed]

FINDINGS: Right Kidney:

Renal measurements: 8.6 x 4.5 x 3.5 cm = volume: 71.1 mL. Diffusely
increased renal cortical echogenicity. Anechoic 7 x 7 x 6 mm simple
appearing cyst in the mid to upper pole right kidney. Several
additional tiny cysts may be present as well though these are poorly
visualized. No concerning renal mass, shadowing calculus or
hydronephrosis.

Left Kidney:

Renal measurements: 9.2 x 4.8 x 4.6 cm = volume: 104.4 mL. Diffusely
increased renal cortical echogenicity. Anechoic 1.2 x 1.5 x 1.2 cm
cyst seen in the lower pole left kidney. No concerning renal mass,
shadowing calculus or hydronephrosis.

Bladder:

Bladder appears decompressed at the time of imaging. Poorly assessed
by ultrasound.

Other:

None.
IMPRESSION: Diffusely increased renal cortical echogenicity compatible with
medical renal disease.

Small simple appearing cysts in both kidneys.

Bladder is decompressed at the time of exam, poorly visualized by
ultrasound.

## 2021-03-24 DIAGNOSIS — D472 Monoclonal gammopathy: Secondary | ICD-10-CM | POA: Insufficient documentation

## 2021-10-19 ENCOUNTER — Emergency Department: Payer: Medicare Other

## 2021-10-19 ENCOUNTER — Inpatient Hospital Stay
Admission: EM | Admit: 2021-10-19 | Discharge: 2021-10-21 | DRG: 177 | Disposition: A | Discharge status: Home / Self Care | Payer: Medicare Other | Attending: Internal Medicine | Admitting: Internal Medicine

## 2021-10-19 DIAGNOSIS — Z885 Allergy status to narcotic agent status: Secondary | ICD-10-CM

## 2021-10-19 DIAGNOSIS — Z8541 Personal history of malignant neoplasm of cervix uteri: Secondary | ICD-10-CM

## 2021-10-19 DIAGNOSIS — N2581 Secondary hyperparathyroidism of renal origin: Secondary | ICD-10-CM | POA: Diagnosis present

## 2021-10-19 DIAGNOSIS — J9601 Acute respiratory failure with hypoxia: Secondary | ICD-10-CM

## 2021-10-19 DIAGNOSIS — J690 Pneumonitis due to inhalation of food and vomit: Secondary | ICD-10-CM | POA: Diagnosis not present

## 2021-10-19 DIAGNOSIS — I129 Hypertensive chronic kidney disease with stage 1 through stage 4 chronic kidney disease, or unspecified chronic kidney disease: Secondary | ICD-10-CM | POA: Diagnosis present

## 2021-10-19 DIAGNOSIS — Z853 Personal history of malignant neoplasm of breast: Secondary | ICD-10-CM

## 2021-10-19 DIAGNOSIS — N179 Acute kidney failure, unspecified: Secondary | ICD-10-CM

## 2021-10-19 DIAGNOSIS — J449 Chronic obstructive pulmonary disease, unspecified: Secondary | ICD-10-CM | POA: Diagnosis present

## 2021-10-19 DIAGNOSIS — E1122 Type 2 diabetes mellitus with diabetic chronic kidney disease: Secondary | ICD-10-CM | POA: Diagnosis present

## 2021-10-19 DIAGNOSIS — G309 Alzheimer's disease, unspecified: Secondary | ICD-10-CM | POA: Diagnosis present

## 2021-10-19 DIAGNOSIS — Z7982 Long term (current) use of aspirin: Secondary | ICD-10-CM

## 2021-10-19 DIAGNOSIS — I10 Essential (primary) hypertension: Secondary | ICD-10-CM

## 2021-10-19 DIAGNOSIS — N184 Chronic kidney disease, stage 4 (severe): Secondary | ICD-10-CM | POA: Diagnosis present

## 2021-10-19 DIAGNOSIS — R55 Syncope and collapse: Secondary | ICD-10-CM | POA: Diagnosis present

## 2021-10-19 DIAGNOSIS — E785 Hyperlipidemia, unspecified: Secondary | ICD-10-CM | POA: Diagnosis present

## 2021-10-19 DIAGNOSIS — R1110 Vomiting, unspecified: Secondary | ICD-10-CM | POA: Diagnosis present

## 2021-10-19 DIAGNOSIS — F02C0 Dementia in other diseases classified elsewhere, severe, without behavioral disturbance, psychotic disturbance, mood disturbance, and anxiety: Secondary | ICD-10-CM | POA: Diagnosis present

## 2021-10-19 DIAGNOSIS — Z79899 Other long term (current) drug therapy: Secondary | ICD-10-CM

## 2021-10-19 DIAGNOSIS — Z8543 Personal history of malignant neoplasm of ovary: Secondary | ICD-10-CM

## 2021-10-19 DIAGNOSIS — Z886 Allergy status to analgesic agent status: Secondary | ICD-10-CM

## 2021-10-19 DIAGNOSIS — F0390 Unspecified dementia without behavioral disturbance: Secondary | ICD-10-CM | POA: Diagnosis present

## 2021-10-19 DIAGNOSIS — J441 Chronic obstructive pulmonary disease with (acute) exacerbation: Secondary | ICD-10-CM

## 2021-10-19 DIAGNOSIS — E119 Type 2 diabetes mellitus without complications: Secondary | ICD-10-CM

## 2021-10-19 DIAGNOSIS — Z87891 Personal history of nicotine dependence: Secondary | ICD-10-CM

## 2021-10-19 NOTE — ED Triage Notes (Addendum)
Pt coming from home via [facility]. Per EMS, family called out. Pt was eating some soup and then started to have an episode of emesis and then went unresponsive for about 1 minute. Family laid pt on her side and then started having emesis again. Pt's oxygen saturation was initially 87% on room air, EMS placed pt on 4 L [place] and O2 went up to 100%. Pt has a hx of Alzheimer and is currently at baseline.  Vital signs with EMS:  BP: 140/70 HR: 70 CBG: 170

## 2021-10-19 NOTE — ED Provider Notes (Signed)
[facility] Provider Note    Event Date/Time   First MD Initiated Contact with Patient 10/19/21 2104     (approximate)   History   Possible Aspiration  EM caveat, some limitation due to confusion with known history of severe dementia  HPI  Kelly Pugh is a 81 y.o. female who on review of primary care records from June of this year has a history of dementia chronic kidney disease ovarian cancer hyperlipidemia monoclonal gammopathy secondary hyperparathyroidism as well as breast cancer.  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  There is no fall or injury.  She then started to arouse, and has been at her mental baseline by the time EMS arrived.  Family related to EMS that the patient dementia so severe she does not even recognize her own last name  She had been preceding this in her normal state of health this occurred suddenly while having soup or something for dinner  EMS advised she did have mild hypoxia on their arrival, and was placed on oxygen     Physical Exam   Triage Vital Signs: ED Triage Vitals [10/19/21 2108]  Enc Vitals Group     BP 137/68     Pulse Rate 72     Resp 20     Temp 98 F (36.7 C)     Temp Source Oral     SpO2 99 %     Weight      Height      Head Circumference      Peak Flow      Pain Score      Pain Loc      Pain Edu?      Excl. in [place]?     Most recent vital signs: Vitals:   10/19/21 2108 10/19/21 2110  BP: 137/68   Pulse: 72   Resp: 20   Temp: 98 F (36.7 C)   SpO2: 99% 99%     General: Awake, no distress.  Knows her name, but cannot tell me her last name.  Disoriented to place and situation.  She denies any pain discomfort or trouble breathing, but again is not deemed a reliable historian CV:  Good peripheral  perfusion.  Normal heart tones Resp:  Normal effort.  Clear lungs bilaterally.  Normal work of breathing without distress.  Swallowing without difficulty.  No stridor. Abd:  No distention.  Soft nontender nondistended all quadrants Other:  Moves all extremities well to command without deficit.  Normal facial expressions and speech is clear.   ED Results / Procedures / Treatments   Labs (all labs ordered are listed, but only abnormal results are displayed) Labs Reviewed  CULTURE, BLOOD (ROUTINE X 2)  CULTURE, BLOOD (ROUTINE X 2)  CBC WITH DIFFERENTIAL/PLATELET  COMPREHENSIVE METABOLIC PANEL  LIPASE, BLOOD  LACTIC ACID, PLASMA  LACTIC ACID, PLASMA  PROCALCITONIN     EKG  Interpreted by me at 2110 heart rate 75 Normal sinus rhythm, some baseline artifact.  No clear evidence of ischemia   RADIOLOGY  Chest x-ray interpreted by me as negative for acute finding  CT Head Wo Contrast  Result Date: 10/20/2021 CLINICAL DATA:  Altered mental status EXAM: CT HEAD WITHOUT CONTRAST TECHNIQUE: Contiguous axial images were obtained from the base of the  skull through the vertex without intravenous contrast. RADIATION DOSE REDUCTION: This exam was performed according to the departmental dose-optimization program which includes automated exposure control, adjustment of the mA and/or kV according to patient size and/or use of iterative reconstruction technique. COMPARISON:  None Available. FINDINGS: Brain: No evidence of acute infarction, hemorrhage, hydrocephalus, extra-axial collection or mass lesion/mass effect. Chronic atrophic changes and ischemic changes are noted. Vascular: No hyperdense vessel or unexpected calcification. Skull: Normal. Negative for fracture or focal lesion. Sinuses/Orbits: No acute finding. Other: None. IMPRESSION: Chronic atrophic and ischemic changes. Electronically Signed   By: Inez Catalina M.D.   On: 10/20/2021 00:02   DG Chest 2 View  Result Date: 10/19/2021 CLINICAL  DATA:  Recent choking incident with unresponsive this, possible aspiration EXAM: CHEST - 2 VIEW COMPARISON:  04/26/2014 FINDINGS: Cardiac shadow is within normal limits. Aortic calcifications are seen. The lungs are clear. No bony abnormality is noted. IMPRESSION: No acute abnormality noted. Electronically Signed   By: Inez Catalina M.D.   On: 10/19/2021 22:03      PROCEDURES:  Critical Care performed: No  Procedures   MEDICATIONS ORDERED IN ED: Medications - No data to display   IMPRESSION / MDM / [place] / ED COURSE  I reviewed the triage vital signs and the nursing notes.                              Differential diagnosis includes, but is not limited to, aspiration, choking, vomiting, others considerations for syncope such as cardiac dysrhythmia, anemia, infection, etc.  Differential diagnosis remains broad but based on the clinical history provided seems the patient likely had some sort of a choking or aspiration type events.  She is currently resting comfortably with normal work of breathing clear chest x-ray normal oxygen saturation on room air.  Quite reassuring but work-up is pending  ----------------------------------------- 12:22 AM on 10/20/2021 ----------------------------------------- Significant delay in patient's work-up including obtaining labs secondary to high acuity in the emergency department.  Nursing working to obtain labs and further work-up at this time.  Unfortunate delay, but prioritization of care required so for other patients.  Ongoing care assigned to Dr. Beather Arbour.  Anticipate admission, but further work-up is pending including all labs  Patient's presentation is most consistent with acute presentation with potential threat to life or bodily function.  The patient is on the cardiac monitor to evaluate for evidence of arrhythmia and/or significant heart rate changes.      FINAL CLINICAL IMPRESSION(S) / ED DIAGNOSES   Final diagnoses:   Syncope and collapse  Possible aspiration event or choking 1 episode of vomiting   Rx / DC Orders   ED Discharge Orders     None        Note:  This document was prepared using Dragon voice recognition software and may include unintentional dictation errors.   Delman Kitten, MD 10/20/21 [phone]

## 2021-10-20 ENCOUNTER — Encounter: Payer: Self-pay | Admitting: Emergency Medicine

## 2021-10-20 ENCOUNTER — Other Ambulatory Visit: Payer: Self-pay

## 2021-10-20 DIAGNOSIS — N184 Chronic kidney disease, stage 4 (severe): Secondary | ICD-10-CM | POA: Diagnosis present

## 2021-10-20 DIAGNOSIS — Z87891 Personal history of nicotine dependence: Secondary | ICD-10-CM | POA: Diagnosis not present

## 2021-10-20 DIAGNOSIS — Z885 Allergy status to narcotic agent status: Secondary | ICD-10-CM | POA: Diagnosis not present

## 2021-10-20 DIAGNOSIS — J690 Pneumonitis due to inhalation of food and vomit: Secondary | ICD-10-CM

## 2021-10-20 DIAGNOSIS — J9601 Acute respiratory failure with hypoxia: Secondary | ICD-10-CM

## 2021-10-20 DIAGNOSIS — E785 Hyperlipidemia, unspecified: Secondary | ICD-10-CM | POA: Diagnosis present

## 2021-10-20 DIAGNOSIS — Z8541 Personal history of malignant neoplasm of cervix uteri: Secondary | ICD-10-CM | POA: Diagnosis not present

## 2021-10-20 DIAGNOSIS — Z79899 Other long term (current) drug therapy: Secondary | ICD-10-CM | POA: Diagnosis not present

## 2021-10-20 DIAGNOSIS — E1122 Type 2 diabetes mellitus with diabetic chronic kidney disease: Secondary | ICD-10-CM | POA: Diagnosis present

## 2021-10-20 DIAGNOSIS — J449 Chronic obstructive pulmonary disease, unspecified: Secondary | ICD-10-CM | POA: Diagnosis present

## 2021-10-20 DIAGNOSIS — Z7982 Long term (current) use of aspirin: Secondary | ICD-10-CM | POA: Diagnosis not present

## 2021-10-20 DIAGNOSIS — G309 Alzheimer's disease, unspecified: Secondary | ICD-10-CM | POA: Diagnosis present

## 2021-10-20 DIAGNOSIS — F02C0 Dementia in other diseases classified elsewhere, severe, without behavioral disturbance, psychotic disturbance, mood disturbance, and anxiety: Secondary | ICD-10-CM | POA: Diagnosis present

## 2021-10-20 DIAGNOSIS — R55 Syncope and collapse: Secondary | ICD-10-CM | POA: Diagnosis present

## 2021-10-20 DIAGNOSIS — Z853 Personal history of malignant neoplasm of breast: Secondary | ICD-10-CM | POA: Diagnosis not present

## 2021-10-20 DIAGNOSIS — I129 Hypertensive chronic kidney disease with stage 1 through stage 4 chronic kidney disease, or unspecified chronic kidney disease: Secondary | ICD-10-CM | POA: Diagnosis present

## 2021-10-20 DIAGNOSIS — R1110 Vomiting, unspecified: Secondary | ICD-10-CM | POA: Diagnosis present

## 2021-10-20 DIAGNOSIS — N2581 Secondary hyperparathyroidism of renal origin: Secondary | ICD-10-CM | POA: Diagnosis present

## 2021-10-20 DIAGNOSIS — J441 Chronic obstructive pulmonary disease with (acute) exacerbation: Secondary | ICD-10-CM

## 2021-10-20 DIAGNOSIS — Z886 Allergy status to analgesic agent status: Secondary | ICD-10-CM | POA: Diagnosis not present

## 2021-10-20 DIAGNOSIS — Z8543 Personal history of malignant neoplasm of ovary: Secondary | ICD-10-CM | POA: Diagnosis not present

## 2021-10-20 LAB — CBC WITH DIFFERENTIAL/PLATELET
Abs Immature Granulocytes: 0.03 10*3/uL (ref 0.00–0.07)
Basophils Absolute: 0 10*3/uL (ref 0.0–0.1)
Basophils Relative: 0 %
Eosinophils Absolute: 0.1 10*3/uL (ref 0.0–0.5)
Eosinophils Relative: 1 %
HCT: 32.7 % — ABNORMAL LOW (ref 36.0–46.0)
Hemoglobin: 10.2 g/dL — ABNORMAL LOW (ref 12.0–15.0)
Immature Granulocytes: 0 %
Lymphocytes Relative: 17 %
Lymphs Abs: 1.8 10*3/uL (ref 0.7–4.0)
MCH: 29.1 pg (ref 26.0–34.0)
MCHC: 31.2 g/dL (ref 30.0–36.0)
MCV: 93.4 fL (ref 80.0–100.0)
Monocytes Absolute: 0.5 10*3/uL (ref 0.1–1.0)
Monocytes Relative: 5 %
Neutro Abs: 7.9 10*3/uL — ABNORMAL HIGH (ref 1.7–7.7)
Neutrophils Relative %: 77 %
Platelets: 283 10*3/uL (ref 150–400)
RBC: 3.5 MIL/uL — ABNORMAL LOW (ref 3.87–5.11)
RDW: 13.9 % (ref 11.5–15.5)
WBC: 10.3 10*3/uL (ref 4.0–10.5)
nRBC: 0 % (ref 0.0–0.2)

## 2021-10-20 LAB — COMPREHENSIVE METABOLIC PANEL
ALT: 26 U/L (ref 0–44)
AST: 39 U/L (ref 15–41)
Albumin: 4.1 g/dL (ref 3.5–5.0)
Alkaline Phosphatase: 67 U/L (ref 38–126)
Anion gap: 7 (ref 5–15)
BUN: 64 mg/dL — ABNORMAL HIGH (ref 8–23)
CO2: 25 mmol/L (ref 22–32)
Calcium: 9.5 mg/dL (ref 8.9–10.3)
Chloride: 114 mmol/L — ABNORMAL HIGH (ref 98–111)
Creatinine, Ser: 2.65 mg/dL — ABNORMAL HIGH (ref 0.44–1.00)
GFR, Estimated: 17 mL/min — ABNORMAL LOW (ref >60)
Glucose, Bld: 116 mg/dL — ABNORMAL HIGH (ref 70–99)
Potassium: 5.4 mmol/L — ABNORMAL HIGH (ref 3.5–5.1)
Sodium: 146 mmol/L — ABNORMAL HIGH (ref 135–145)
Total Bilirubin: 0.9 mg/dL (ref 0.3–1.2)
Total Protein: 8.5 g/dL — ABNORMAL HIGH (ref 6.5–8.1)

## 2021-10-20 LAB — BASIC METABOLIC PANEL
Anion gap: 3 — ABNORMAL LOW (ref 5–15)
BUN: 65 mg/dL — ABNORMAL HIGH (ref 8–23)
CO2: 23 mmol/L (ref 22–32)
Calcium: 9.4 mg/dL (ref 8.9–10.3)
Chloride: 118 mmol/L — ABNORMAL HIGH (ref 98–111)
Creatinine, Ser: 2.58 mg/dL — ABNORMAL HIGH (ref 0.44–1.00)
GFR, Estimated: 18 mL/min — ABNORMAL LOW (ref >60)
Glucose, Bld: 116 mg/dL — ABNORMAL HIGH (ref 70–99)
Potassium: 5 mmol/L (ref 3.5–5.1)
Sodium: 144 mmol/L (ref 135–145)

## 2021-10-20 LAB — CBC
HCT: 32 % — ABNORMAL LOW (ref 36.0–46.0)
Hemoglobin: 10 g/dL — ABNORMAL LOW (ref 12.0–15.0)
MCH: 28.5 pg (ref 26.0–34.0)
MCHC: 31.3 g/dL (ref 30.0–36.0)
MCV: 91.2 fL (ref 80.0–100.0)
Platelets: 240 10*3/uL (ref 150–400)
RBC: 3.51 MIL/uL — ABNORMAL LOW (ref 3.87–5.11)
RDW: 13.8 % (ref 11.5–15.5)
WBC: 13.7 10*3/uL — ABNORMAL HIGH (ref 4.0–10.5)
nRBC: 0 % (ref 0.0–0.2)

## 2021-10-20 LAB — CREATININE, SERUM
Creatinine, Ser: 2.61 mg/dL — ABNORMAL HIGH (ref 0.44–1.00)
GFR, Estimated: 18 mL/min — ABNORMAL LOW (ref >60)

## 2021-10-20 LAB — HEMOGLOBIN A1C
Hgb A1c MFr Bld: 5.8 % — ABNORMAL HIGH (ref 4.8–5.6)
Mean Plasma Glucose: 119.76 mg/dL

## 2021-10-20 LAB — LACTIC ACID, PLASMA
Lactic Acid, Venous: 1.2 mmol/L (ref 0.5–1.9)
Lactic Acid, Venous: 0.8 mmol/L (ref 0.5–1.9)

## 2021-10-20 LAB — CBG MONITORING, ED: Glucose-Capillary: 98 mg/dL (ref 70–99)

## 2021-10-20 LAB — GLUCOSE, CAPILLARY: Glucose-Capillary: 94 mg/dL (ref 70–99)

## 2021-10-20 LAB — LIPASE, BLOOD: Lipase: 85 U/L — ABNORMAL HIGH (ref 11–51)

## 2021-10-20 LAB — PROCALCITONIN: Procalcitonin: 0.29 ng/mL

## 2021-10-20 LAB — TROPONIN I (HIGH SENSITIVITY)
Troponin I (High Sensitivity): 22 ng/L — ABNORMAL HIGH (ref <18)
Troponin I (High Sensitivity): 20 ng/L — ABNORMAL HIGH (ref <18)

## 2021-10-20 MED ORDER — DONEPEZIL HCL 5 MG PO TABS
5.0000 mg | ORAL_TABLET | Freq: Every day | ORAL | Status: DC
  Administered 2021-10-20: 5 mg via ORAL
  Filled 2021-10-20: qty 1

## 2021-10-20 MED ORDER — INSULIN ASPART 100 UNIT/ML IJ SOLN: 0.0000 [IU] | INTRAMUSCULAR | Status: DC

## 2021-10-20 MED ORDER — SODIUM CHLORIDE 0.9 % IV SOLN: INTRAVENOUS | Status: DC

## 2021-10-20 MED ORDER — ONDANSETRON HCL 4 MG PO TABS: 4.0000 mg | ORAL_TABLET | Freq: Four times a day (QID) | ORAL | Status: DC | PRN

## 2021-10-20 MED ORDER — HEPARIN SODIUM (PORCINE) 5000 UNIT/ML IJ SOLN
5000.0000 [IU] | Freq: Three times a day (TID) | INTRAMUSCULAR | Status: DC
  Administered 2021-10-20 – 2021-10-21 (×4): 5000 [IU] via SUBCUTANEOUS
  Filled 2021-10-20 (×4): qty 1

## 2021-10-20 MED ORDER — AMLODIPINE BESYLATE 10 MG PO TABS
10.0000 mg | ORAL_TABLET | Freq: Every day | ORAL | Status: DC
  Administered 2021-10-20 – 2021-10-21 (×2): 10 mg via ORAL
  Filled 2021-10-20 (×2): qty 1

## 2021-10-20 MED ORDER — SODIUM CHLORIDE 0.9 % IV SOLN: 3.0000 g | Freq: Once | INTRAVENOUS | Status: DC

## 2021-10-20 MED ORDER — ONDANSETRON HCL 4 MG/2ML IJ SOLN: 4.0000 mg | Freq: Four times a day (QID) | INTRAMUSCULAR | Status: DC | PRN

## 2021-10-20 MED ORDER — ACETAMINOPHEN 650 MG RE SUPP: 650.0000 mg | Freq: Four times a day (QID) | RECTAL | Status: DC | PRN

## 2021-10-20 MED ORDER — SODIUM CHLORIDE 0.9 % IV BOLUS
1000.0000 mL | Freq: Once | INTRAVENOUS | Status: AC
  Administered 2021-10-20: 1000 mL via INTRAVENOUS

## 2021-10-20 MED ORDER — LATANOPROST 0.005 % OP SOLN
1.0000 [drp] | Freq: Every day | OPHTHALMIC | Status: DC
Start: 2021-10-21 — End: 2021-10-21

## 2021-10-20 MED ORDER — CARVEDILOL 3.125 MG PO TABS
6.2500 mg | ORAL_TABLET | Freq: Two times a day (BID) | ORAL | Status: DC
  Administered 2021-10-20 – 2021-10-21 (×3): 6.25 mg via ORAL
  Filled 2021-10-20 (×3): qty 2

## 2021-10-20 MED ORDER — SODIUM CHLORIDE 0.9 % IV SOLN
3.0000 g | Freq: Two times a day (BID) | INTRAVENOUS | Status: DC
  Administered 2021-10-20: 3 g via INTRAVENOUS
  Filled 2021-10-20: qty 8

## 2021-10-20 MED ORDER — ALBUTEROL SULFATE (2.5 MG/3ML) 0.083% IN NEBU
3.0000 mL | INHALATION_SOLUTION | Freq: Four times a day (QID) | RESPIRATORY_TRACT | Status: DC | PRN
  Administered 2021-10-20 – 2021-10-21 (×2): 3 mL via RESPIRATORY_TRACT
  Filled 2021-10-20 (×2): qty 3

## 2021-10-20 MED ORDER — ENOXAPARIN SODIUM 40 MG/0.4ML IJ SOSY: 40.0000 mg | PREFILLED_SYRINGE | INTRAMUSCULAR | Status: DC

## 2021-10-20 MED ORDER — ALBUTEROL SULFATE (2.5 MG/3ML) 0.083% IN NEBU
2.5000 mg | INHALATION_SOLUTION | Freq: Once | RESPIRATORY_TRACT | Status: AC
  Administered 2021-10-20: 2.5 mg via RESPIRATORY_TRACT
  Filled 2021-10-20: qty 3

## 2021-10-20 MED ORDER — SODIUM CHLORIDE 0.9 % IV SOLN: INTRAVENOUS | Status: AC

## 2021-10-20 MED ORDER — HYDRALAZINE HCL 20 MG/ML IJ SOLN: 5.0000 mg | Freq: Four times a day (QID) | INTRAMUSCULAR | Status: DC | PRN

## 2021-10-20 MED ORDER — ACETAMINOPHEN 325 MG PO TABS: 650.0000 mg | ORAL_TABLET | Freq: Four times a day (QID) | ORAL | Status: DC | PRN

## 2021-10-20 NOTE — ED Provider Notes (Signed)
-----------------------------------------   12:30 AM on 10/20/2021 -----------------------------------------   Assumed care from Dr. Jacqualine Code pending lab work and admission.   ----------------------------------------- 3:39 AM on 10/20/2021 -----------------------------------------   Delayed due to patient's primary nurse with other critical patients.  Laboratory results demonstrate normal WBC 10.3, mild hyperkalemia potassium 5.4, AKI BUN 64/creatinine 2.65.  Elevated lipase 85 and minimally elevated troponin 22.  Lactic acid negative but procalcitonin positive.  Given patient's history and symptoms, will initiate IV Unasyn for probable aspiration pneumonia, not seen yet on chest x-ray.  CT head unremarkable for ICH.  Will consult hospital services for evaluation and admission.   Paulette Blanch, MD 10/20/21 [phone]

## 2021-10-20 NOTE — Progress Notes (Addendum)
Interim progress note not for billing. Admitted a few hours ago by my colleague.  15fhx dementia, htn, ckd4, dm, copd, lives with daughter, presenting after a transient episode of vomiting and unresponsiveness at home. Was hypoxic on arrival and is being treated for aspiration pneumonia/pneumonitis. On exam Kelly Pugh appears back to baseline and is breathing comfortably now off o2. There has been no further vomiting, no abdominal pain, no diarrhea. Daughter says Kelly Pugh appears to be back to baseline. CT head without acute findings. Potassium mildly elevated at 5.4. no fever or keukocytosis on infiltrate seen on cxr. Given rapid resolution of respiratory symptoms and no clinical signs of pneumonia will stop abx. Will continue gentle hydration and repeat bmp to assess potassium. For mildly elevated trop without chest pain or ekg findings suggesting ischemia doubt acs but will repeat to ensure stability. If patient remains stable can likely discharge home tomorrow. I will obtain SLP swallow eval given the concern for aspiration though if there was aspiration appears to be mainly a consequence of vomiting.

## 2021-10-20 NOTE — Progress Notes (Signed)
Pharmacy Antibiotic Note  Kelly Pugh is a 81 y.o. female admitted on 10/19/2021 with aspiration PNA.  Pharmacy has been consulted for Unasyn dosing.  Plan: Unasyn 3 gm IV Q12H ordered to start on 9/1 @ 0430.   Height: '5\' 3"'$  (160 cm) Weight: 51.3 kg (113 lb) IBW/kg (Calculated) : 52.4  Temp (24hrs), Avg:98 F (36.7 C), Min:98 F (36.7 C), Max:98 F (36.7 C)  Recent Labs  Lab 10/20/21 0119  WBC 10.3  CREATININE 2.65*  LATICACIDVEN 1.2    Estimated Creatinine Clearance: 13.3 mL/min (A) (by C-G formula based on SCr of 2.65 mg/dL (H)).    Allergies  Allergen Reactions   Hydrocodone-Acetaminophen Nausea Only   Oxycodone-Acetaminophen Nausea Only    Antimicrobials this admission:   >>    >>   Dose adjustments this admission:   Microbiology results:  BCx:   UCx:    Sputum:    MRSA PCR:   Thank you for allowing pharmacy to be a part of this patient's care.  Quinesha Selinger D 10/20/2021 4:28 AM

## 2021-10-20 NOTE — Progress Notes (Signed)
SLP Cancellation Note  Patient Details Name: Kelly Pugh MRN: [id number] DOB: [birth year]   Cancelled treatment:       Reason Eval/Treat Not Completed: SLP screened, no needs identified, will sign off  Colbi Schiltz B. Rutherford Nail, M.S., CCC-SLP, Mining engineer Certified [facility]  [place] Office [phone] Ascom [phone] Fax [phone]

## 2021-10-20 NOTE — ED Notes (Signed)
Patient incontinent stool and urine. Peri care performed. Clean linens, brief, and gown applied. Pt repositioned back in bed for comfort. Bed low and locked with side rails raised x2. Call bell in reach and monitor in place. Family at bedside. Pt breathing unlabored with symmetric chest rise and fall. Denies pain or other needs at this time.   Pt daughter provided with peanut butter and water per request.

## 2021-10-20 NOTE — ED Notes (Signed)
Family requesting breathing treatment. Per daughter, pt takes breathing treatment at night. Dr Beather Arbour made aware. See new orders.

## 2021-10-20 NOTE — Assessment & Plan Note (Signed)
Albuterol as needed 

## 2021-10-20 NOTE — Assessment & Plan Note (Signed)
Hydralazine IV as needed while n.p.o. 

## 2021-10-20 NOTE — TOC Initial Note (Signed)
Transition of Care [facility]) - Initial/Assessment Note    Patient Details  Name: DURENDA PECHACEK MRN: [id number] Date of Birth: 1940/07/29  Transition of Care [facility]) CM/SW Contact:    Conception Oms, RN Phone Number: 10/20/2021, 9:51 AM  Clinical Narrative:                   Transition of Care [facility]) Screening Note   Patient Details  Name: NAKIESHA RUMSEY Date of Birth: [birth year]   Transition of Care [facility]) CM/SW Contact:    Conception Oms, RN Phone Number: 10/20/2021, 9:51 AM  From home with her daughter  Transition of Care Department [facility]) has reviewed patient and no TOC needs have been identified at this time. We will continue to monitor patient advancement through interdisciplinary progression rounds. If new patient transition needs arise, please place a TOC consult.         Patient Goals and CMS Choice        Expected Discharge Plan and Services                                                Prior Living Arrangements/Services                       Activities of Daily Living Home Assistive Devices/Equipment: Nebulizer ADL Screening (condition at time of admission) Patient's cognitive ability adequate to safely complete daily activities?: Yes Is the patient deaf or have difficulty hearing?: No Does the patient have difficulty seeing, even when wearing glasses/contacts?: No Does the patient have difficulty concentrating, remembering, or making decisions?: Yes Patient able to express need for assistance with ADLs?: Yes Does the patient have difficulty dressing or bathing?: No Independently performs ADLs?: Yes (appropriate for developmental age) Does the patient have difficulty walking or climbing stairs?: No Weakness of Legs: None Weakness of Arms/Hands: None  Permission Sought/Granted                  Emotional Assessment              Admission diagnosis:  Syncope and collapse [R55] Aspiration pneumonia ([place])  [J69.0] AKI (acute kidney injury) ([place]) [N17.9] Aspiration pneumonia, unspecified aspiration pneumonia type, unspecified laterality, unspecified part of lung ([place]) [J69.0] Patient Active Problem List   Diagnosis Date Noted   Aspiration pneumonia ([place]) 10/20/2021   Acute respiratory failure with hypoxia ([place]) 10/20/2021   COPD  10/20/2021   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   Dementia, old age, without behavioral disturbance ([place]) 12/09/2019   Abnormal SPEP 08/19/2019   Normocytic anemia 08/19/2019   Type II diabetes mellitus ([place]) 08/19/2019   Chronic kidney disease, stage 4 (severe) ([place]) 03/06/2019   Abnormal ECG 04/04/2018   Central retinal vein occlusion, left eye, with macular edema 01/27/2018   Encounter for monitoring aromatase inhibitor therapy 10/09/2017   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   Malignant neoplasm of upper-outer quadrant of right breast in female, estrogen receptor positive ([place]) 10/23/2016   History of breast cancer 10/23/2016   Hyperlipidemia, mixed 01/14/2013   Personal history of ovarian cancer 12/12/2010   HTN (hypertension) 12/11/2010   Mild intermittent asthma without complication 26/71/2458   Chemotherapy follow-up examination 02/06/2010   Primary malignant neoplasm of ovary ([place]) 02/06/2010   PCP:  Tommie Sams, MD Pharmacy:   [place] #  [place], [place] [address] [place] [address] Phone: [phone] Fax: [phone]     Social Determinants of Health (SDOH) Interventions    Readmission Risk Interventions     No data to display

## 2021-10-20 NOTE — Assessment & Plan Note (Signed)
Hold letrozole while n.p.o.

## 2021-10-20 NOTE — Assessment & Plan Note (Signed)
Acute respiratory failure with hypoxia Continue supplemental oxygen to keep sats over 94% Continue Unasyn Aspiration precautions Keep n.p.o. for now Speech therapy evaluation

## 2021-10-20 NOTE — H&P (Signed)
History and Physical    Patient: Kelly Pugh DVV:[id number] DOB: Oct 17, 1940 DOA: 10/19/2021 DOS: the patient was seen and examined on 10/20/2021 PCP: Tommie Sams, MD  Patient coming from: Home  Chief Complaint:  Chief Complaint  Patient presents with   Possible Aspiration    HPI: Kelly Pugh is a 81 y.o. female with medical history significant for Diabetes, dementia, CKD 4 and history of breast cancer who presents to the ED by EMS following an unresponsive episode following an episode of vomiting after choking on her dinner.  She came around after about a minute  On arrival of EMS O2 sat was 87% on room air improving to 100% on 4 L.  Patient unable to contribute to history. ED course and data review: Vitals within normal limits with O2 sat 99% on 4 L.  Labs significant for creatinine of 2.65 which is her baseline, potassium 5.4, sodium 146.  Hemoglobin at baseline at 10.2.  Lipase 85, LFTs WNL.  Troponin 22.  Pro-Calc 0.29.  EKG, personally viewed and interpreted showing sinus at 74 with nonspecific ST-T wave changes CT head nonacute and chest x-ray with no acute changes Patient was given a albuterol for wheezing and started on Unasyn preemptively for aspiration pneumonitis/pneumonia.  Hospitalist consulted for admission.    Past Medical History:  Diagnosis Date   Cervical cancer [facility])    Past Surgical History:  Procedure Laterality Date   BREAST LUMPECTOMY     Social History:  reports that she has quit smoking. Her smoking use included cigarettes. She has never used smokeless tobacco. She reports that she does not drink alcohol and does not use drugs.  Allergies  Allergen Reactions   Hydrocodone-Acetaminophen Nausea Only   Oxycodone-Acetaminophen Nausea Only    History reviewed. No pertinent family history.  Prior to Admission medications   Medication Sig Start Date End Date Taking? Authorizing Provider  acetaminophen (TYLENOL) 650 MG CR tablet Take 650  mg by mouth every 8 (eight) hours as needed for pain.   Yes [provider]  albuterol (ACCUNEB) 0.63 MG/3ML nebulizer solution Inhale 3 mLs into the lungs every 6 (six) hours as needed for wheezing. 01/27/21 01/27/22 Yes [provider]  amLODipine (NORVASC) 10 MG tablet Take by mouth. 05/15/19 10/20/21 Yes [provider]  atorvastatin (LIPITOR) 10 MG tablet Take by mouth. 06/08/19 10/20/21 Yes [provider]  carvedilol (COREG) 6.25 MG tablet Take 6.25 mg by mouth 2 (two) times daily. 10/03/21  Yes [provider]  donepezil (ARICEPT) 5 MG tablet Take 5 mg by mouth at bedtime. 07/19/21  Yes [provider]  albuterol (VENTOLIN HFA) 108 (90 Base) MCG/ACT inhaler Inhale 2 puffs into the lungs every 6 (six) hours as needed for wheezing.    [provider]  aspirin 81 MG chewable tablet Frequency:OTHER   Dosage:0.0     Instructions:  Note:Dose: UNKNOWN Patient not taking: Reported on 08/19/2019 10/27/04   [provider]  brimonidine (ALPHAGAN P) 0.1 % SOLN Apply to eye. Patient not taking: Reported on 10/20/2021 05/16/18   [provider]  dorzolamide-timolol (COSOPT) 22.3-6.8 MG/ML ophthalmic solution INSTILL 1 DROP INTO EACH EYE TWICE DAILY Patient not taking: Reported on 10/20/2021 07/03/18   [provider]  fluticasone (FLOVENT HFA) 110 MCG/ACT inhaler Inhale into the lungs. 04/04/18   [provider]  glimepiride (AMARYL) 1 MG tablet  04/04/13   [provider]  hydrochlorothiazide (HYDRODIURIL) 12.5 MG tablet Frequency:SEEESIG   Dosage:0.0  Instructions:  Note:Dose: UNKNOWN 10/27/04   [provider]  latanoprost (XALATAN) 0.005 % ophthalmic solution Apply to eye. 04/29/19   [provider]  letrozole (FEMARA) 2.5 MG tablet Take by mouth. 06/22/19   [provider]  losartan (COZAAR) 25 MG tablet Take by mouth. 06/08/19 06/07/20  [provider]    Physical  Exam: Vitals:   10/19/21 2108 10/19/21 2110 10/20/21 0122 10/20/21 0123  BP: 137/68   128/66  Pulse: 72   73  Resp: 20   19  Temp: 98 F (36.7 C)   98 F (36.7 C)  TempSrc: Oral   Oral  SpO2: 99% 99%  100%  Weight:   51.3 kg   Height:   '5\' 3"'$  (1.6 m)    Physical Exam Vitals and nursing note reviewed.  Constitutional:      General: She is not in acute distress. HENT:     Head: Normocephalic and atraumatic.  Cardiovascular:     Rate and Rhythm: Normal rate and regular rhythm.     Heart sounds: Normal heart sounds.  Pulmonary:     Effort: Pulmonary effort is normal.     Breath sounds: Normal breath sounds.  Abdominal:     Palpations: Abdomen is soft.     Tenderness: There is no abdominal tenderness.  Neurological:     Mental Status: Mental status is at baseline.     Labs on Admission: I have personally reviewed following labs and imaging studies  CBC: Recent Labs  Lab 10/20/21 0119  WBC 10.3  NEUTROABS 7.9*  HGB 10.2*  HCT 32.7*  MCV 93.4  PLT 741   Basic Metabolic Panel: Recent Labs  Lab 10/20/21 0119  NA 146*  K 5.4*  CL 114*  CO2 25  GLUCOSE 116*  BUN 64*  CREATININE 2.65*  CALCIUM 9.5   GFR: Estimated Creatinine Clearance: 13.3 mL/min (A) (by C-G formula based on SCr of 2.65 mg/dL (H)). Liver Function Tests: Recent Labs  Lab 10/20/21 0119  AST 39  ALT 26  ALKPHOS 67  BILITOT 0.9  PROT 8.5*  ALBUMIN 4.1   Recent Labs  Lab 10/20/21 0119  LIPASE 85*   No results for input(s): "AMMONIA" in the last 168 hours. Coagulation Profile: No results for input(s): "INR", "PROTIME" in the last 168 hours. Cardiac Enzymes: No results for input(s): "CKTOTAL", "CKMB", "CKMBINDEX", "TROPONINI" in the last 168 hours. BNP (last 3 results) No results for input(s): "PROBNP" in the last 8760 hours. HbA1C: No results for input(s): "HGBA1C" in the last 72 hours. CBG: Recent Labs  Lab 10/20/21 0138  GLUCAP 98   Lipid Profile: No results for input(s):  "CHOL", "HDL", "LDLCALC", "TRIG", "CHOLHDL", "LDLDIRECT" in the last 72 hours. Thyroid Function Tests: No results for input(s): "TSH", "T4TOTAL", "FREET4", "T3FREE", "THYROIDAB" in the last 72 hours. Anemia Panel: No results for input(s): "VITAMINB12", "FOLATE", "FERRITIN", "TIBC", "IRON", "RETICCTPCT" in the last 72 hours. Urine analysis:    Component Value Date/Time   COLORURINE ORANGE 02/09/2013 1541   APPEARANCEUR CLOUDY 02/09/2013 1541   LABSPEC 1.010 02/09/2013 1541   PHURINE 6.0 02/09/2013 1541   GLUCOSEU NEGATIVE 02/09/2013 1541   HGBUR 3+ 02/09/2013 1541   BILIRUBINUR SEE COMMENT 02/09/2013 1541   KETONESUR SEE COMMENT 02/09/2013 1541   PROTEINUR 100 mg/dL 02/09/2013 1541   NITRITE POSITIVE 02/09/2013 1541   LEUKOCYTESUR 3+ 02/09/2013 1541    Radiological Exams on Admission: CT Head Wo Contrast  Result Date: 10/20/2021 CLINICAL DATA:  Altered mental status  EXAM: CT HEAD WITHOUT CONTRAST TECHNIQUE: Contiguous axial images were obtained from the base of the skull through the vertex without intravenous contrast. RADIATION DOSE REDUCTION: This exam was performed according to the departmental dose-optimization program which includes automated exposure control, adjustment of the mA and/or kV according to patient size and/or use of iterative reconstruction technique. COMPARISON:  None Available. FINDINGS: Brain: No evidence of acute infarction, hemorrhage, hydrocephalus, extra-axial collection or mass lesion/mass effect. Chronic atrophic changes and ischemic changes are noted. Vascular: No hyperdense vessel or unexpected calcification. Skull: Normal. Negative for fracture or focal lesion. Sinuses/Orbits: No acute finding. Other: None. IMPRESSION: Chronic atrophic and ischemic changes. Electronically Signed   By: Inez Catalina M.D.   On: 10/20/2021 00:02   DG Chest 2 View  Result Date: 10/19/2021 CLINICAL DATA:  Recent choking incident with unresponsive this, possible aspiration EXAM:  CHEST - 2 VIEW COMPARISON:  04/26/2014 FINDINGS: Cardiac shadow is within normal limits. Aortic calcifications are seen. The lungs are clear. No bony abnormality is noted. IMPRESSION: No acute abnormality noted. Electronically Signed   By: Inez Catalina M.D.   On: 10/19/2021 22:03     Data Reviewed: Relevant notes from primary care and specialist visits, past discharge summaries as available in EHR, including Care Everywhere. Prior diagnostic testing as pertinent to current admission diagnoses Updated medications and problem lists for reconciliation ED course, including vitals, labs, imaging, treatment and response to treatment Triage notes, nursing and pharmacy notes and ED provider's notes Notable results as noted in HPI   Assessment and Plan: * Aspiration pneumonia ([place]) Acute respiratory failure with hypoxia Continue supplemental oxygen to keep sats over 94% Continue Unasyn Aspiration precautions Keep n.p.o. for now Speech therapy evaluation  COPD  Albuterol as needed  History of breast cancer Hold letrozole while n.p.o.  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   Type II diabetes mellitus (HCC) Sliding scale insulin coverage  Chronic kidney disease, stage 4 (severe) (HCC) Renal function at baseline  HTN (hypertension) Hydralazine IV as needed while n.p.o.        DVT prophylaxis: Lovenox  Consults: none  Advance Care Planning: dull code  Family Communication: Daughter at bedside  Disposition  Plan: Back to previous home environment  Severity of Illness: The appropriate patient status for this patient is INPATIENT. Inpatient status is judged to be reasonable and necessary in order to provide the required intensity of service to ensure the patient's safety. The patient's presenting symptoms, physical exam findings, and initial radiographic and laboratory data in the context of their chronic comorbidities is felt to place them at high risk for further clinical deterioration. Furthermore, it is not anticipated that the patient will be medically stable for discharge from the hospital within 2 midnights of admission.   * I certify that at the point of admission it is my clinical judgment that the patient will require inpatient hospital care spanning beyond 2 midnights from the point of admission due to high intensity of service, high risk for further deterioration and high frequency of surveillance required.*  Author: Athena Masse, MD 10/20/2021 4:10 AM  For on call review [email or web address].

## 2021-10-20 NOTE — Assessment & Plan Note (Signed)
Renal function at baseline 

## 2021-10-20 NOTE — Progress Notes (Signed)
Anticoagulation monitoring(Lovenox):  81 yo female ordered Lovenox 40 mg Q24h    Filed Weights   10/20/21 0122  Weight: 51.3 kg (113 lb)   BMI 20   Lab Results  Component Value Date   CREATININE 2.65 (H) 10/20/2021   CREATININE 2.01 (H) 08/19/2019   Estimated Creatinine Clearance: 13.3 mL/min (A) (by C-G formula based on SCr of 2.65 mg/dL (H)). Hemoglobin & Hematocrit     Component Value Date/Time   HGB 10.2 (L) 10/20/2021 0119   HCT 32.7 (L) 10/20/2021 0119     Per Protocol for Patient with estCrcl < 15 ml/min and BMI < 30, will transition to Heparin 5000 units SQ Q8H.

## 2021-10-20 NOTE — Assessment & Plan Note (Signed)
Sliding scale insulin coverage 

## 2021-10-20 NOTE — Assessment & Plan Note (Signed)
Delirium precautions --Get up during the day --Keep blinds open and lights on during daylight hours --Minimize the use of opioids/benzodiazepines --Reorient the patient frequently, provide easily visible clock and calendar --Provide sensory aids like glasses, hearing aids --Encourage ambulation, regular activities and visitors to maintain cognitive stimulation  --Patient would benefit from having family members at bedside to reinforce his orientation.

## 2021-10-21 DIAGNOSIS — J690 Pneumonitis due to inhalation of food and vomit: Secondary | ICD-10-CM | POA: Diagnosis not present

## 2021-10-21 LAB — BASIC METABOLIC PANEL
Anion gap: 6 (ref 5–15)
BUN: 54 mg/dL — ABNORMAL HIGH (ref 8–23)
CO2: 21 mmol/L — ABNORMAL LOW (ref 22–32)
Calcium: 8.9 mg/dL (ref 8.9–10.3)
Chloride: 116 mmol/L — ABNORMAL HIGH (ref 98–111)
Creatinine, Ser: 2.27 mg/dL — ABNORMAL HIGH (ref 0.44–1.00)
GFR, Estimated: 21 mL/min — ABNORMAL LOW (ref >60)
Glucose, Bld: 99 mg/dL (ref 70–99)
Potassium: 4.6 mmol/L (ref 3.5–5.1)
Sodium: 143 mmol/L (ref 135–145)

## 2021-10-21 LAB — URINALYSIS, ROUTINE W REFLEX MICROSCOPIC
Bilirubin Urine: NEGATIVE
Glucose, UA: NEGATIVE mg/dL
Hgb urine dipstick: NEGATIVE
Ketones, ur: NEGATIVE mg/dL
Leukocytes,Ua: NEGATIVE
Nitrite: NEGATIVE
Protein, ur: 30 mg/dL — AB
Specific Gravity, Urine: 1.014 (ref 1.005–1.030)
pH: 5 (ref 5.0–8.0)

## 2021-10-21 LAB — CBC
HCT: 27 % — ABNORMAL LOW (ref 36.0–46.0)
Hemoglobin: 8.8 g/dL — ABNORMAL LOW (ref 12.0–15.0)
MCH: 30.1 pg (ref 26.0–34.0)
MCHC: 32.6 g/dL (ref 30.0–36.0)
MCV: 92.5 fL (ref 80.0–100.0)
Platelets: 208 10*3/uL (ref 150–400)
RBC: 2.92 MIL/uL — ABNORMAL LOW (ref 3.87–5.11)
RDW: 14 % (ref 11.5–15.5)
WBC: 13.5 10*3/uL — ABNORMAL HIGH (ref 4.0–10.5)
nRBC: 0 % (ref 0.0–0.2)

## 2021-10-21 MED ORDER — AMOXICILLIN-POT CLAVULANATE 500-125 MG PO TABS: 1.0000 | ORAL_TABLET | Freq: Two times a day (BID) | ORAL | 0 refills | Status: AC

## 2021-10-21 MED ORDER — AMOXICILLIN-POT CLAVULANATE 500-125 MG PO TABS
1.0000 | ORAL_TABLET | Freq: Two times a day (BID) | ORAL | 0 refills | Status: DC
Start: 2021-10-21 — End: 2021-10-21

## 2021-10-21 NOTE — Plan of Care (Addendum)
Reviewed discharge instructions with pt and daughters. Pt and daughters verbalized understanding. Pt discharged with all personal belongings. IV cath intact when removed. Staff wheeled pt out. Pt transported to home via family vehicle.    Problem: Activity: Goal: Ability to tolerate increased activity will improve Outcome: Adequate for Discharge   Problem: Clinical Measurements: Goal: Ability to maintain a body temperature in the normal range will improve Outcome: Adequate for Discharge   Problem: Respiratory: Goal: Ability to maintain adequate ventilation will improve Outcome: Adequate for Discharge Goal: Ability to maintain a clear airway will improve Outcome: Adequate for Discharge   Problem: Education: Goal: Knowledge of General Education information will improve Description: Including pain rating scale, medication(s)/side effects and non-pharmacologic comfort measures Outcome: Adequate for Discharge   Problem: Health Behavior/Discharge Planning: Goal: Ability to manage health-related needs will improve Outcome: Adequate for Discharge   Problem: Clinical Measurements: Goal: Ability to maintain clinical measurements within normal limits will improve Outcome: Adequate for Discharge Goal: Will remain free from infection Outcome: Adequate for Discharge Goal: Diagnostic test results will improve Outcome: Adequate for Discharge Goal: Respiratory complications will improve Outcome: Adequate for Discharge Goal: Cardiovascular complication will be avoided Outcome: Adequate for Discharge   Problem: Activity: Goal: Risk for activity intolerance will decrease Outcome: Adequate for Discharge   Problem: Nutrition: Goal: Adequate nutrition will be maintained Outcome: Adequate for Discharge   Problem: Coping: Goal: Level of anxiety will decrease Outcome: Adequate for Discharge   Problem: Elimination: Goal: Will not experience complications related to bowel motility Outcome:  Adequate for Discharge Goal: Will not experience complications related to urinary retention Outcome: Adequate for Discharge   Problem: Pain Managment: Goal: General experience of comfort will improve Outcome: Adequate for Discharge   Problem: Safety: Goal: Ability to remain free from injury will improve Outcome: Adequate for Discharge   Problem: Skin Integrity: Goal: Risk for impaired skin integrity will decrease Outcome: Adequate for Discharge

## 2021-10-21 NOTE — Discharge Summary (Signed)
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 female with medical history significant for Diabetes, dementia, CKD 4 and history of breast cancer who presents to the ED by EMS following an unresponsive episode following an episode of vomiting after choking on her dinner.  She came around after about a minute  On arrival of EMS O2 sat was 87% on room air improving to 100% on 4 L. Treated with IV antibiotics and weaned to room air. Patient was alert and oriented without any neuro deficits at the time of discharge. She was able to tolerate PO without concern.   Assessment and Plan: Aspiration pneumonia ([place]) Acute respiratory failure with hypoxia  On arrival of EMS O2 sat was 87% on room air improving to 100% on 4 L. Treated with IV antibiotics and weaned to room air. Transitioned to oral abx. Speech therapy did not have any concerns.   Vasovagal syncope Occurring in the setting of vomiting and then choking, resulting in syncope. No c/f seizure, stroke, or other neurologic deficits. Her neuro exam was intact.   COPD  Albuterol as needed  History of breast cancer held letrozole while n.p.o, patient reports not taking without any recent dispense noted by pharmacy. Per onc note from 08/2021 also confirms this. Removed from medicine list on  discharge.   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, without behavioral disturbance ([place])      Consultants: None Procedures performed: None  Disposition: Home Diet recommendation:  Discharge Diet Orders (From admission, onward)     Start     Ordered   10/21/21 0000  Diet - low sodium heart healthy        10/21/21 1111           Regular diet DISCHARGE MEDICATION: Allergies as of 10/21/2021       Reactions   Hydrocodone-acetaminophen Nausea Only   Oxycodone-acetaminophen Nausea Only        Medication List     STOP taking these medications    aspirin 81 MG chewable tablet   brimonidine 0.1 % Soln Commonly known as: ALPHAGAN P   dorzolamide-timolol 22.3-6.8 MG/ML ophthalmic solution Commonly known as: COSOPT   fluticasone 110 MCG/ACT inhaler Commonly known as: FLOVENT HFA   glimepiride 1 MG tablet Commonly known as: AMARYL   hydrochlorothiazide 12.5 MG tablet Commonly known as: HYDRODIURIL   letrozole 2.5 MG tablet Commonly known as: FEMARA       TAKE these medications    acetaminophen 650 MG CR tablet Commonly known as: TYLENOL Take 650 mg by mouth every 8 (eight) hours as needed for pain.   albuterol 108 (90 Base) MCG/ACT inhaler Commonly known as: VENTOLIN HFA Inhale 2 puffs into the lungs every 6 (six)  hours as needed for wheezing.   albuterol 0.63 MG/3ML nebulizer solution Commonly known as: ACCUNEB Inhale 3 mLs into the lungs every 6 (six) hours as needed for wheezing.   amLODipine 10 MG tablet Commonly known as: NORVASC Take by mouth.   amoxicillin-clavulanate 500-125 MG tablet Commonly known as: Augmentin Take 1 tablet (500 mg total) by mouth in the morning and at bedtime for 4 days.   atorvastatin 10 MG tablet Commonly known as: LIPITOR Take by mouth.   carvedilol 6.25 MG tablet Commonly known as: COREG Take 6.25 mg  by mouth 2 (two) times daily.   cyanocobalamin 1000 MCG tablet Commonly known as: VITAMIN B12 Take 1,000 mcg by mouth daily.   donepezil 5 MG tablet Commonly known as: ARICEPT Take 5 mg by mouth at bedtime.   latanoprost 0.005 % ophthalmic solution Commonly known as: XALATAN Place 1 drop into both eyes at bedtime.   losartan 25 MG tablet Commonly known as: COZAAR Take 25 mg by mouth daily.   Multi-Vitamin tablet Take 1 tablet by mouth daily.   Vitamin D-1000 Max St 25 MCG (1000 UT) tablet Generic drug: Cholecalciferol Take 1,000 Units by mouth daily.        Discharge Exam: Filed Weights   10/20/21 0122  Weight: 51.3 kg   Physical Exam Vitals and nursing note reviewed.  Constitutional:      Appearance: Normal appearance. She is not ill-appearing.  HENT:     Head: Normocephalic and atraumatic.     Mouth/Throat:     Mouth: Mucous membranes are moist.  Cardiovascular:     Rate and Rhythm: Normal rate and regular rhythm.  Abdominal:     General: Abdomen is flat. There is no distension.     Palpations: Abdomen is soft. There is no mass.     Tenderness: There is no abdominal tenderness.  Musculoskeletal:     Right lower leg: No edema.     Left lower leg: No edema.  Skin:    General: Skin is warm and dry.     Capillary Refill: Capillary refill takes less than 2 seconds.  Neurological:     Mental Status: She is alert.  Psychiatric:        Mood and Affect: Mood normal.        Behavior: Behavior normal.      Condition at discharge: good  The results of significant diagnostics from this hospitalization (including imaging, microbiology, ancillary and laboratory) are listed below for reference.   Imaging Studies: CT Head Wo Contrast  Result Date: 10/20/2021 CLINICAL DATA:  Altered mental status EXAM: CT HEAD WITHOUT CONTRAST TECHNIQUE: Contiguous axial images were obtained from the base of the skull through the vertex without intravenous contrast. RADIATION DOSE  REDUCTION: This exam was performed according to the departmental dose-optimization program which includes automated exposure control, adjustment of the mA and/or kV according to patient size and/or use of iterative reconstruction technique. COMPARISON:  None Available. FINDINGS: Brain: No evidence of acute infarction, hemorrhage, hydrocephalus, extra-axial collection or mass lesion/mass effect. Chronic atrophic changes and ischemic changes are noted. Vascular: No hyperdense vessel or unexpected calcification. Skull: Normal. Negative for fracture or focal lesion. Sinuses/Orbits: No acute finding. Other: None. IMPRESSION: Chronic atrophic and ischemic changes. Electronically Signed   By: Inez Catalina M.D.   On: 10/20/2021 00:02   DG Chest 2 View  Result Date: 10/19/2021 CLINICAL DATA:  Recent choking incident with unresponsive this, possible aspiration EXAM: CHEST - 2 VIEW COMPARISON:  04/26/2014 FINDINGS:  Cardiac shadow is within normal limits. Aortic calcifications are seen. The lungs are clear. No bony abnormality is noted. IMPRESSION: No acute abnormality noted. Electronically Signed   By: Inez Catalina M.D.   On: 10/19/2021 22:03    Microbiology: Results for orders placed or performed during the hospital encounter of 10/19/21  Blood culture (routine x 2)     Status: None (Preliminary result)   Collection Time: 10/20/21  1:19 AM   Specimen: BLOOD RIGHT ARM  Result Value Ref Range Status   Specimen Description BLOOD RIGHT ARM  Final   Special Requests   Final    BOTTLES DRAWN AEROBIC AND ANAEROBIC Blood Culture results may not be optimal due to an excessive volume of blood received in culture bottles   Culture   Final    NO GROWTH 1 DAY Performed at [facility], [address] [place], [place] [address]    Report Status PENDING  Incomplete  Blood culture (routine x 2)     Status: None (Preliminary result)   Collection Time: 10/20/21  1:19 AM   Specimen: BLOOD RIGHT ARM  Result  Value Ref Range Status   Specimen Description BLOOD RIGHT ARM  Final   Special Requests   Final    BOTTLES DRAWN AEROBIC AND ANAEROBIC Blood Culture adequate volume   Culture   Final    NO GROWTH 1 DAY Performed at [facility], [address] [place], [place] [address]    Report Status PENDING  Incomplete    Labs: CBC: Recent Labs  Lab 10/20/21 0119 10/20/21 0645 10/21/21 0611  WBC 10.3 13.7* 13.5*  NEUTROABS 7.9*  --   --   HGB 10.2* 10.0* 8.8*  HCT 32.7* 32.0* 27.0*  MCV 93.4 91.2 92.5  PLT 283 240 761   Basic Metabolic Panel: Recent Labs  Lab 10/20/21 0119 10/20/21 0645 10/21/21 0611  NA 146* 144 143  K 5.4* 5.0 4.6  CL 114* 118* 116*  CO2 25 23 21*  GLUCOSE 116* 116* 99  BUN 64* 65* 54*  CREATININE 2.65* 2.58*  2.61* 2.27*  CALCIUM 9.5 9.4 8.9   Liver Function Tests: Recent Labs  Lab 10/20/21 0119  AST 39  ALT 26  ALKPHOS 67  BILITOT 0.9  PROT 8.5*  ALBUMIN 4.1   CBG: Recent Labs  Lab 10/20/21 0138 10/20/21 0845  GLUCAP 98 94    Discharge time spent: less than 30 minutes.  Signed: Lorelei Pont, MD [facility] 10/21/2021

## 2021-10-22 LAB — URINE CULTURE: Culture: NO GROWTH

## 2021-10-25 LAB — CULTURE, BLOOD (ROUTINE X 2)
Culture: NO GROWTH
Culture: NO GROWTH
Special Requests: ADEQUATE

## 2022-02-07 ENCOUNTER — Other Ambulatory Visit: Payer: Self-pay | Admitting: Internal Medicine

## 2022-02-07 DIAGNOSIS — M7989 Other specified soft tissue disorders: Secondary | ICD-10-CM

## 2022-02-14 ENCOUNTER — Ambulatory Visit
Admission: RE | Admit: 2022-02-14 | Discharge: 2022-02-14 | Disposition: A | Discharge status: Home / Self Care | Payer: Medicare Other | Source: Ambulatory Visit | Attending: Internal Medicine | Admitting: Internal Medicine

## 2022-02-14 DIAGNOSIS — M7989 Other specified soft tissue disorders: Secondary | ICD-10-CM | POA: Diagnosis present

## 2023-07-20 ENCOUNTER — Encounter: Payer: Self-pay | Admitting: Internal Medicine

## 2023-07-20 ENCOUNTER — Emergency Department

## 2023-07-20 ENCOUNTER — Other Ambulatory Visit: Payer: Self-pay

## 2023-07-20 ENCOUNTER — Inpatient Hospital Stay
Admission: EM | Admit: 2023-07-20 | Discharge: 2023-07-23 | DRG: 871 | Disposition: A | Discharge status: Hospice | Attending: Internal Medicine | Admitting: Internal Medicine

## 2023-07-20 DIAGNOSIS — E119 Type 2 diabetes mellitus without complications: Secondary | ICD-10-CM

## 2023-07-20 DIAGNOSIS — J441 Chronic obstructive pulmonary disease with (acute) exacerbation: Secondary | ICD-10-CM | POA: Diagnosis present

## 2023-07-20 DIAGNOSIS — Z9221 Personal history of antineoplastic chemotherapy: Secondary | ICD-10-CM

## 2023-07-20 DIAGNOSIS — A419 Sepsis, unspecified organism: Secondary | ICD-10-CM | POA: Diagnosis not present

## 2023-07-20 DIAGNOSIS — I21A1 Myocardial infarction type 2: Secondary | ICD-10-CM | POA: Diagnosis present

## 2023-07-20 DIAGNOSIS — Z823 Family history of stroke: Secondary | ICD-10-CM

## 2023-07-20 DIAGNOSIS — Z7984 Long term (current) use of oral hypoglycemic drugs: Secondary | ICD-10-CM

## 2023-07-20 DIAGNOSIS — N179 Acute kidney failure, unspecified: Secondary | ICD-10-CM | POA: Diagnosis present

## 2023-07-20 DIAGNOSIS — J440 Chronic obstructive pulmonary disease with acute lower respiratory infection: Secondary | ICD-10-CM | POA: Diagnosis present

## 2023-07-20 DIAGNOSIS — R64 Cachexia: Secondary | ICD-10-CM | POA: Diagnosis present

## 2023-07-20 DIAGNOSIS — Z6820 Body mass index (BMI) 20.0-20.9, adult: Secondary | ICD-10-CM

## 2023-07-20 DIAGNOSIS — Z170 Estrogen receptor positive status [ER+]: Secondary | ICD-10-CM

## 2023-07-20 DIAGNOSIS — Z515 Encounter for palliative care: Secondary | ICD-10-CM | POA: Diagnosis not present

## 2023-07-20 DIAGNOSIS — R6520 Severe sepsis without septic shock: Secondary | ICD-10-CM | POA: Diagnosis present

## 2023-07-20 DIAGNOSIS — J9602 Acute respiratory failure with hypercapnia: Secondary | ICD-10-CM | POA: Diagnosis present

## 2023-07-20 DIAGNOSIS — E875 Hyperkalemia: Secondary | ICD-10-CM | POA: Diagnosis present

## 2023-07-20 DIAGNOSIS — E782 Mixed hyperlipidemia: Secondary | ICD-10-CM | POA: Diagnosis present

## 2023-07-20 DIAGNOSIS — I10 Essential (primary) hypertension: Secondary | ICD-10-CM | POA: Diagnosis not present

## 2023-07-20 DIAGNOSIS — Z66 Do not resuscitate: Secondary | ICD-10-CM | POA: Diagnosis present

## 2023-07-20 DIAGNOSIS — J189 Pneumonia, unspecified organism: Secondary | ICD-10-CM | POA: Diagnosis not present

## 2023-07-20 DIAGNOSIS — J181 Lobar pneumonia, unspecified organism: Secondary | ICD-10-CM | POA: Diagnosis present

## 2023-07-20 DIAGNOSIS — N184 Chronic kidney disease, stage 4 (severe): Secondary | ICD-10-CM | POA: Diagnosis present

## 2023-07-20 DIAGNOSIS — C50411 Malignant neoplasm of upper-outer quadrant of right female breast: Secondary | ICD-10-CM

## 2023-07-20 DIAGNOSIS — Z789 Other specified health status: Secondary | ICD-10-CM | POA: Diagnosis not present

## 2023-07-20 DIAGNOSIS — E874 Mixed disorder of acid-base balance: Secondary | ICD-10-CM | POA: Diagnosis present

## 2023-07-20 DIAGNOSIS — R9431 Abnormal electrocardiogram [ECG] [EKG]: Secondary | ICD-10-CM | POA: Diagnosis not present

## 2023-07-20 DIAGNOSIS — I129 Hypertensive chronic kidney disease with stage 1 through stage 4 chronic kidney disease, or unspecified chronic kidney disease: Secondary | ICD-10-CM | POA: Diagnosis present

## 2023-07-20 DIAGNOSIS — Z8543 Personal history of malignant neoplasm of ovary: Secondary | ICD-10-CM

## 2023-07-20 DIAGNOSIS — Z833 Family history of diabetes mellitus: Secondary | ICD-10-CM

## 2023-07-20 DIAGNOSIS — F0390 Unspecified dementia without behavioral disturbance: Secondary | ICD-10-CM | POA: Diagnosis present

## 2023-07-20 DIAGNOSIS — J90 Pleural effusion, not elsewhere classified: Secondary | ICD-10-CM | POA: Diagnosis not present

## 2023-07-20 DIAGNOSIS — Z8541 Personal history of malignant neoplasm of cervix uteri: Secondary | ICD-10-CM | POA: Diagnosis not present

## 2023-07-20 DIAGNOSIS — Z87891 Personal history of nicotine dependence: Secondary | ICD-10-CM | POA: Diagnosis not present

## 2023-07-20 DIAGNOSIS — E1122 Type 2 diabetes mellitus with diabetic chronic kidney disease: Secondary | ICD-10-CM | POA: Diagnosis present

## 2023-07-20 DIAGNOSIS — J9601 Acute respiratory failure with hypoxia: Principal | ICD-10-CM | POA: Diagnosis present

## 2023-07-20 DIAGNOSIS — E872 Acidosis, unspecified: Secondary | ICD-10-CM | POA: Diagnosis present

## 2023-07-20 DIAGNOSIS — Z853 Personal history of malignant neoplasm of breast: Secondary | ICD-10-CM

## 2023-07-20 DIAGNOSIS — Z7189 Other specified counseling: Secondary | ICD-10-CM | POA: Diagnosis not present

## 2023-07-20 DIAGNOSIS — Z7401 Bed confinement status: Secondary | ICD-10-CM

## 2023-07-20 DIAGNOSIS — Z79899 Other long term (current) drug therapy: Secondary | ICD-10-CM | POA: Diagnosis not present

## 2023-07-20 DIAGNOSIS — R7989 Other specified abnormal findings of blood chemistry: Secondary | ICD-10-CM

## 2023-07-20 DIAGNOSIS — Z885 Allergy status to narcotic agent status: Secondary | ICD-10-CM

## 2023-07-20 LAB — BLOOD GAS, VENOUS
Acid-base deficit: 6.1 mmol/L — ABNORMAL HIGH (ref 0.0–2.0)
Bicarbonate: 24.6 mmol/L (ref 20.0–28.0)
O2 Saturation: 53.3 %
Patient temperature: 37
pCO2, Ven: 74 mmHg (ref 44–60)
pH, Ven: 7.13 — CL (ref 7.25–7.43)
pO2, Ven: 39 mmHg (ref 32–45)

## 2023-07-20 LAB — CBC WITH DIFFERENTIAL/PLATELET
Abs Immature Granulocytes: 0.07 10*3/uL (ref 0.00–0.07)
Basophils Absolute: 0.1 10*3/uL (ref 0.0–0.1)
Basophils Relative: 0 %
Eosinophils Absolute: 0 10*3/uL (ref 0.0–0.5)
Eosinophils Relative: 0 %
HCT: 27.8 % — ABNORMAL LOW (ref 36.0–46.0)
Hemoglobin: 8.9 g/dL — ABNORMAL LOW (ref 12.0–15.0)
Immature Granulocytes: 0 %
Lymphocytes Relative: 3 %
Lymphs Abs: 0.6 10*3/uL — ABNORMAL LOW (ref 0.7–4.0)
MCH: 29.2 pg (ref 26.0–34.0)
MCHC: 32 g/dL (ref 30.0–36.0)
MCV: 91.1 fL (ref 80.0–100.0)
Monocytes Absolute: 0.8 10*3/uL (ref 0.1–1.0)
Monocytes Relative: 5 %
Neutro Abs: 14.6 10*3/uL — ABNORMAL HIGH (ref 1.7–7.7)
Neutrophils Relative %: 92 %
Platelets: 325 10*3/uL (ref 150–400)
RBC: 3.05 MIL/uL — ABNORMAL LOW (ref 3.87–5.11)
RDW: 13.8 % (ref 11.5–15.5)
WBC: 16 10*3/uL — ABNORMAL HIGH (ref 4.0–10.5)
nRBC: 0 % (ref 0.0–0.2)

## 2023-07-20 LAB — COMPREHENSIVE METABOLIC PANEL WITH GFR
ALT: 24 U/L (ref 0–44)
AST: 37 U/L (ref 15–41)
Albumin: 2.8 g/dL — ABNORMAL LOW (ref 3.5–5.0)
Alkaline Phosphatase: 49 U/L (ref 38–126)
Anion gap: 8 (ref 5–15)
BUN: 47 mg/dL — ABNORMAL HIGH (ref 8–23)
CO2: 23 mmol/L (ref 22–32)
Calcium: 8.8 mg/dL — ABNORMAL LOW (ref 8.9–10.3)
Chloride: 108 mmol/L (ref 98–111)
Creatinine, Ser: 1.92 mg/dL — ABNORMAL HIGH (ref 0.44–1.00)
GFR, Estimated: 25 mL/min — ABNORMAL LOW (ref >60)
Glucose, Bld: 88 mg/dL (ref 70–99)
Potassium: 5.8 mmol/L — ABNORMAL HIGH (ref 3.5–5.1)
Sodium: 139 mmol/L (ref 135–145)
Total Bilirubin: 1 mg/dL (ref 0.0–1.2)
Total Protein: 6.8 g/dL (ref 6.5–8.1)

## 2023-07-20 LAB — RESP PANEL BY RT-PCR (RSV, FLU A&B, COVID)  RVPGX2
Influenza A by PCR: NEGATIVE
Influenza B by PCR: NEGATIVE
Resp Syncytial Virus by PCR: NEGATIVE
SARS Coronavirus 2 by RT PCR: NEGATIVE

## 2023-07-20 LAB — TROPONIN I (HIGH SENSITIVITY)
Troponin I (High Sensitivity): 1140 ng/L (ref <18)
Troponin I (High Sensitivity): 1253 ng/L

## 2023-07-20 LAB — BRAIN NATRIURETIC PEPTIDE: B Natriuretic Peptide: 716.4 pg/mL — ABNORMAL HIGH (ref 0.0–100.0)

## 2023-07-20 LAB — PROTIME-INR
INR: 1.1 (ref 0.8–1.2)
Prothrombin Time: 13.9 s (ref 11.4–15.2)

## 2023-07-20 LAB — MRSA NEXT GEN BY PCR, NASAL: MRSA by PCR Next Gen: NOT DETECTED

## 2023-07-20 LAB — LACTIC ACID, PLASMA
Lactic Acid, Venous: 3.8 mmol/L (ref 0.5–1.9)
Lactic Acid, Venous: 3.3 mmol/L (ref 0.5–1.9)

## 2023-07-20 LAB — TSH: TSH: 2.216 u[IU]/mL (ref 0.350–4.500)

## 2023-07-20 MED ORDER — LACTATED RINGERS IV SOLN
150.0000 mL/h | INTRAVENOUS | Status: DC
  Administered 2023-07-20: 150 mL/h via INTRAVENOUS

## 2023-07-20 MED ORDER — VANCOMYCIN HCL IN DEXTROSE 1-5 GM/200ML-% IV SOLN
1000.0000 mg | Freq: Once | INTRAVENOUS | Status: AC
  Administered 2023-07-20: 1000 mg via INTRAVENOUS
  Filled 2023-07-20: qty 200

## 2023-07-20 MED ORDER — VANCOMYCIN HCL IN NACL 1-0.9 GM/200ML-% IV SOLN: 1000.0000 mg | Freq: Once | INTRAVENOUS | Status: DC

## 2023-07-20 MED ORDER — LACTATED RINGERS IV BOLUS (SEPSIS)
500.0000 mL | Freq: Once | INTRAVENOUS | Status: AC
  Administered 2023-07-20: 500 mL via INTRAVENOUS

## 2023-07-20 MED ORDER — METOPROLOL TARTRATE 5 MG/5ML IV SOLN: 5.0000 mg | INTRAVENOUS | Status: DC | PRN

## 2023-07-20 MED ORDER — HEPARIN SODIUM (PORCINE) 5000 UNIT/ML IJ SOLN
5000.0000 [IU] | Freq: Three times a day (TID) | INTRAMUSCULAR | Status: DC
  Administered 2023-07-21 – 2023-07-23 (×7): 5000 [IU] via SUBCUTANEOUS
  Filled 2023-07-20 (×7): qty 1

## 2023-07-20 MED ORDER — SODIUM CHLORIDE 0.9 % IV SOLN
2.0000 g | Freq: Once | INTRAVENOUS | Status: AC
  Administered 2023-07-20: 2 g via INTRAVENOUS
  Filled 2023-07-20: qty 20

## 2023-07-20 MED ORDER — ACETAMINOPHEN 325 MG PO TABS
650.0000 mg | ORAL_TABLET | Freq: Four times a day (QID) | ORAL | Status: DC | PRN
  Administered 2023-07-23: 650 mg via ORAL
  Filled 2023-07-20 (×2): qty 2

## 2023-07-20 MED ORDER — ONDANSETRON HCL 4 MG PO TABS: 4.0000 mg | ORAL_TABLET | Freq: Four times a day (QID) | ORAL | Status: DC | PRN

## 2023-07-20 MED ORDER — SODIUM CHLORIDE 0.9 % IV SOLN
2.0000 g | INTRAVENOUS | Status: DC
  Filled 2023-07-20: qty 20

## 2023-07-20 MED ORDER — LABETALOL HCL 5 MG/ML IV SOLN
10.0000 mg | Freq: Once | INTRAVENOUS | Status: AC
  Administered 2023-07-20: 10 mg via INTRAVENOUS
  Filled 2023-07-20: qty 4

## 2023-07-20 MED ORDER — SODIUM CHLORIDE 0.9 % IV SOLN
500.0000 mg | Freq: Once | INTRAVENOUS | Status: AC
  Administered 2023-07-20: 500 mg via INTRAVENOUS
  Filled 2023-07-20: qty 5

## 2023-07-20 MED ORDER — SODIUM CHLORIDE 0.9 % IV SOLN
500.0000 mg | INTRAVENOUS | Status: DC
  Administered 2023-07-21 – 2023-07-22 (×2): 500 mg via INTRAVENOUS
  Filled 2023-07-20 (×4): qty 5

## 2023-07-20 MED ORDER — ACETAMINOPHEN 650 MG RE SUPP
650.0000 mg | Freq: Four times a day (QID) | RECTAL | Status: DC | PRN
  Administered 2023-07-20: 650 mg via RECTAL
  Filled 2023-07-20: qty 1

## 2023-07-20 MED ORDER — HYDRALAZINE HCL 20 MG/ML IJ SOLN
5.0000 mg | Freq: Four times a day (QID) | INTRAMUSCULAR | Status: DC | PRN
  Administered 2023-07-20 – 2023-07-21 (×2): 5 mg via INTRAVENOUS
  Filled 2023-07-20 (×2): qty 1

## 2023-07-20 MED ORDER — VANCOMYCIN HCL 750 MG/150ML IV SOLN: 750.0000 mg | INTRAVENOUS | Status: DC

## 2023-07-20 MED ORDER — HYDROCORTISONE SOD SUC (PF) 100 MG IJ SOLR
50.0000 mg | Freq: Four times a day (QID) | INTRAMUSCULAR | Status: DC
  Administered 2023-07-20 – 2023-07-23 (×11): 50 mg via INTRAVENOUS
  Filled 2023-07-20: qty 1
  Filled 2023-07-20 (×4): qty 2
  Filled 2023-07-20 (×5): qty 1
  Filled 2023-07-20: qty 2
  Filled 2023-07-20 (×4): qty 1

## 2023-07-20 MED ORDER — LACTATED RINGERS IV BOLUS
1000.0000 mL | Freq: Once | INTRAVENOUS | Status: AC
  Administered 2023-07-20: 1000 mL via INTRAVENOUS

## 2023-07-20 MED ORDER — ONDANSETRON HCL 4 MG/2ML IJ SOLN: 4.0000 mg | Freq: Four times a day (QID) | INTRAMUSCULAR | Status: DC | PRN

## 2023-07-20 MED ORDER — SENNOSIDES-DOCUSATE SODIUM 8.6-50 MG PO TABS: 1.0000 | ORAL_TABLET | Freq: Every evening | ORAL | Status: DC | PRN

## 2023-07-20 MED ORDER — IPRATROPIUM-ALBUTEROL 0.5-2.5 (3) MG/3ML IN SOLN: 3.0000 mL | Freq: Four times a day (QID) | RESPIRATORY_TRACT | Status: DC | PRN

## 2023-07-20 NOTE — Assessment & Plan Note (Addendum)
 Hypothermia Lactic acid 3.8, markedly elevated leukocytosis Organ involvement are cardiac and pulmonary Etiology is likely multifactorial including commune acquired pneumonia versus UTI UA has been ordered and pending collection-nurse was reminded to send urine Strict I's and O's Continue BiPAP therapy, continuous pulse oximetry, continue Bair hugger Continue with antibiotics as mentioned above

## 2023-07-20 NOTE — Assessment & Plan Note (Addendum)
 No wheezing. Continuous pulse oximetry DuoNebs every 6 hours as needed for shortness of breath and wheezing,

## 2023-07-20 NOTE — ED Notes (Signed)
RT at bedside to place pt on BIPAP

## 2023-07-20 NOTE — Assessment & Plan Note (Addendum)
 Home carvedilol  6.25 mg p.o. twice daily, losartan 25 mg daily, amlodipine  10 mg daily were not resumed on admission as patient is n.p.o. AM team to resume when the benefits outweigh the risk Hydralazine  5 mg IV every 6 hours as needed for SBP greater than 170, 5 days ordered

## 2023-07-20 NOTE — ED Notes (Signed)
 IV stick for blood draw and cultures unsuccessful x2. Lab called to obtain cultures and blood work.

## 2023-07-20 NOTE — Consult Note (Addendum)
 NAME:  Kelly Pugh, MRN:  [id number], DOB:  January 13, 1941, LOS: 0 ADMISSION DATE:  07/20/2023, CONSULTATION DATE:  07/20/2023 REFERRING MD:  Seleta Dakins, MD, CHIEF COMPLAINT:  Respiratory Failure   History of Present Illness:   Patient is an 83 year old female with history of dementia who presented to the ED with altered mental status and respiratory distress  Patient is demented and nonverbal.  I was unable to obtain any meaningful history from conversing with the patient and history was obtained through review of the medical record.  She was brought in by EMS with SPO2 of 60% requiring escalation of oxygen therapy to nonrebreather and subsequently BiPAP.  Intubation was deferred given the patient's DNR/DNI CODE STATUS. Patient noted to be contracted and non-verbal.  Initial blood work showed hyperkalemia, AKI, metabolic and respiratory acidosis, elevated BNP, elevated troponin, and lactic acidosis.  Her white count is elevated at 16,000 and a chest x-ray shows a near completely opacified left hemithorax.  She was placed on BiPAP with FiO2 of 60% and improvement in her oxygenation.  She is not hypotensive but is tachypneic and tachycardic.  She was started on broad-spectrum antibiotics with ceftriaxone and azithromycin and admitted to [facility].  We are consulted for evaluation of a possible left-sided pleural effusion.  Pertinent  Medical History  -Dementia -Type 2 diabetes -Hypertension -CKD -COPD  Objective    Blood pressure (!) 141/115, pulse 84, temperature (!) 93.1 F (33.9 C), temperature source Rectal, resp. rate (!) 26, height 5\' 3"  (1.6 m), weight 51.3 kg, SpO2 99%.    FiO2 (%):  [40 %-60 %] 60 %   Intake/Output Summary (Last 24 hours) at 07/20/2023 1412 Last data filed at 07/20/2023 1356 Gross per 24 hour  Intake 600 ml  Output --  Net 600 ml   Filed Weights   07/20/23 1111  Weight: 51.3 kg    Examination: Physical Exam Constitutional:      General: She is in acute  distress.     Appearance: She is ill-appearing.  Cardiovascular:     Rate and Rhythm: Regular rhythm. Tachycardia present.     Heart sounds: Normal heart sounds.  Pulmonary:     Breath sounds: No wheezing.  Neurological:     Mental Status: She is disoriented.     Comments: contracted      Assessment and Plan   #Acute hypoxic and hypercapnic respiratory failure #Left lower lobe pneumonia #AKI  #Metabolic Acidosis #Dementia #DNR/DNI  83 year old female with history of dementia presenting with respiratory failure and findings of a left-sided pneumonia on chest x-ray.  I have evaluated the patient with point-of-care ultrasound and note a fully collapsed left lower lobe with hepatization consistent with consolidation and pneumonia.  There is no pleural effusion or loculation and a pleural intervention is not indicated.  Chest x-ray and point-of-care ultrasound findings coupled with lactic acidosis and leukocytosis is consistent with respiratory failure secondary to the lobar pneumonia. While BNP is elevated, the IVC is collapsible on my point-of-care ultrasound and RV and LV function are grossly preserved.  It is possible that the patient has aspirated or has developed mucous plugging resulting in complete left lower lobe collapse.  Her goals of care preclude her from undergoing intubation and mechanical ventilation for the purpose of flexible bronchoscopy.  Would continue with broad-spectrum antibiotics, obtain strep/legionella urinary antigens, initiate hydrocortisone 50 mg q 6 hours (per CAPECOD trial), and continue with goals of care conversations with family.  -continue BiPAP -continue with broad spectrum  antibiotics -check strep and legionella urinary antigens -Hydrocortisone 50 mg IV q 6 hours -PCCM to sign off   Vergia Glasgow, MD [facility] 07/20/2023 2:53 PM    Labs   CBC: Recent Labs  Lab 07/20/23 1148  WBC 16.0*  NEUTROABS 14.6*  HGB 8.9*  HCT  27.8*  MCV 91.1  PLT 325    Basic Metabolic Panel: Recent Labs  Lab 07/20/23 1148  NA 139  K 5.8*  CL 108  CO2 23  GLUCOSE 88  BUN 47*  CREATININE 1.92*  CALCIUM 8.8*   GFR: Estimated Creatinine Clearance: 17.7 mL/min (A) (by C-G formula based on SCr of 1.92 mg/dL (H)). Recent Labs  Lab 07/20/23 1148  WBC 16.0*  LATICACIDVEN 3.8*    Liver Function Tests: Recent Labs  Lab 07/20/23 1148  AST 37  ALT 24  ALKPHOS 49  BILITOT 1.0  PROT 6.8  ALBUMIN 2.8*   No results for input(s): "LIPASE", "AMYLASE" in the last 168 hours. No results for input(s): "AMMONIA" in the last 168 hours.  ABG    Component Value Date/Time   HCO3 24.6 07/20/2023 1148   ACIDBASEDEF 6.1 (H) 07/20/2023 1148   O2SAT 53.3 07/20/2023 1148     Coagulation Profile: Recent Labs  Lab 07/20/23 1148  INR 1.1    Cardiac Enzymes: No results for input(s): "CKTOTAL", "CKMB", "CKMBINDEX", "TROPONINI" in the last 168 hours.  HbA1C: Hgb A1c MFr Bld  Date/Time Value Ref Range Status  10/20/2021 01:17 AM 5.8 (H) 4.8 - 5.6 % Final    Comment:    (NOTE) Pre diabetes:          5.7%-6.4%  Diabetes:              >6.4%  Glycemic control for   <7.0% adults with diabetes     CBG: No results for input(s): "GLUCAP" in the last 168 hours.  Review of Systems:   N/A  Past Medical History:  She,  has a past medical history of Cervical cancer (HCC).   Surgical History:   Past Surgical History:  Procedure Laterality Date   BREAST LUMPECTOMY       Social History:   reports that she has quit smoking. Her smoking use included cigarettes. She has never used smokeless tobacco. She reports that she does not drink alcohol and does not use drugs.   Family History:  Her family history includes Dementia in her sister; Diabetes in her sister; Stroke in her sister.   Allergies Allergies  Allergen Reactions   Hydrocodone-Acetaminophen  Nausea Only   Oxycodone-Acetaminophen  Nausea Only     Home  Medications  Prior to Admission medications   Medication Sig Start Date End Date Taking? Authorizing Provider  albuterol  (VENTOLIN  HFA) 108 (90 Base) MCG/ACT inhaler Inhale 2 puffs into the lungs every 6 (six) hours as needed for wheezing.   Yes [provider]  atorvastatin (LIPITOR) 10 MG tablet Take 1 tablet by mouth daily. 08/02/22  Yes [provider]  carvedilol  (COREG ) 6.25 MG tablet Take 6.25 mg by mouth 2 (two) times daily. 10/03/21  Yes [provider]  donepezil  (ARICEPT ) 5 MG tablet Take 5 mg by mouth at bedtime. 07/19/21  Yes [provider]  latanoprost  (XALATAN ) 0.005 % ophthalmic solution Place 1 drop into both eyes at bedtime. 04/29/19  Yes [provider]  losartan (COZAAR) 25 MG tablet Take 1 tablet by mouth daily. 06/17/23  Yes [provider]  acetaminophen  (TYLENOL ) 650 MG CR tablet  Take 650 mg by mouth every 8 (eight) hours as needed for pain.    [provider]  amLODipine  (NORVASC ) 10 MG tablet Take 1 tablet by mouth daily. Patient not taking: Reported on 07/20/2023 02/19/23   [provider]  Cholecalciferol (VITAMIN D-1000 MAX ST) 25 MCG (1000 UT) tablet Take 1,000 Units by mouth daily. Patient not taking: Reported on 07/20/2023 06/14/21   [provider]  cyanocobalamin (VITAMIN B12) 1000 MCG tablet Take 1,000 mcg by mouth daily. Patient not taking: Reported on 07/20/2023 01/21/20   [provider]      I spent 60 minutes caring for this patient today, including preparing to see the patient, obtaining a medical history , reviewing a separately obtained history, performing a medically appropriate examination and/or evaluation, counseling and educating the patient/family/caregiver, ordering medications, tests, or procedures, documenting clinical information in the electronic health record, and independently interpreting results (not separately reported/billed) and communicating results to the  patient/family/caregiver

## 2023-07-20 NOTE — ED Provider Notes (Signed)
 [facility] Provider Note    Event Date/Time   First MD Initiated Contact with Patient 07/20/23 1111     (approximate)   History   Respiratory Distress   HPI  Kelly Pugh is a 83 y.o. female with a history of hypertension, CKD, type 2 diabetes, COPD, dementia, and aspiration pneumonia who presents with respiratory distress, acute onset this morning, associated with hypoxia to as low as the 60s on room air per EMS.  The patient is nonverbal and is unable to give any history.  I have the past medical records.  The patient was most recently admitted to the hospitalist service in September 2023 after presenting with unresponsiveness and respiratory failure, and was treated for aspiration pneumonia.   Physical Exam   Triage Vital Signs: ED Triage Vitals  Encounter Vitals Group     BP 07/20/23 1110 (!) 172/70     Systolic BP Percentile --      Diastolic BP Percentile --      Pulse Rate 07/20/23 1110 97     Resp 07/20/23 1110 (!) 26     Temp --      Temp src --      SpO2 07/20/23 1110 97 %     Weight 07/20/23 1111 113 lb 1.5 oz (51.3 kg)     Height 07/20/23 1111 5\' 3"  (1.6 m)     Head Circumference --      Peak Flow --      Pain Score --      Pain Loc --      Pain Education --      Exclude from Growth Chart --     Most recent vital signs: Vitals:   07/20/23 1226 07/20/23 1330  BP:  (!) 141/115  Pulse:  84  Resp:  (!) 26  Temp: (!) 93.1 F (33.9 C)   SpO2:  99%     General: Alert, frail-appearing, in no acute distress. CV:  Good peripheral perfusion.  Resp:  Increased respiratory effort with accessory muscle use.  Diminished lung sounds bilaterally with no wheezes or rales. Abd:  No distention.  Other:  No peripheral edema.  Motor intact in all extremities.  EOMI.   ED Results / Procedures / Treatments   Labs (all labs ordered are listed, but only abnormal results are displayed) Labs Reviewed  LACTIC ACID, PLASMA - Abnormal;  Notable for the following components:      Result Value   Lactic Acid, Venous 3.8 (*)    All other components within normal limits  LACTIC ACID, PLASMA - Abnormal; Notable for the following components:   Lactic Acid, Venous 3.3 (*)    All other components within normal limits  COMPREHENSIVE METABOLIC PANEL WITH GFR - Abnormal; Notable for the following components:   Potassium 5.8 (*)    BUN 47 (*)    Creatinine, Ser 1.92 (*)    Calcium 8.8 (*)    Albumin 2.8 (*)    GFR, Estimated 25 (*)    All other components within normal limits  CBC WITH DIFFERENTIAL/PLATELET - Abnormal; Notable for the following components:   WBC 16.0 (*)    RBC 3.05 (*)    Hemoglobin 8.9 (*)    HCT 27.8 (*)    Neutro Abs 14.6 (*)    Lymphs Abs 0.6 (*)    All other components within normal limits  BRAIN NATRIURETIC PEPTIDE - Abnormal; Notable for the following components:   B Natriuretic Peptide  716.4 (*)    All other components within normal limits  BLOOD GAS, VENOUS - Abnormal; Notable for the following components:   pH, Ven 7.13 (*)    pCO2, Ven 74 (*)    Acid-base deficit 6.1 (*)    All other components within normal limits  TROPONIN I (HIGH SENSITIVITY) - Abnormal; Notable for the following components:   Troponin I (High Sensitivity) 1,140 (*)    All other components within normal limits  TROPONIN I (HIGH SENSITIVITY) - Abnormal; Notable for the following components:   Troponin I (High Sensitivity) 1,253 (*)    All other components within normal limits  RESP PANEL BY RT-PCR (RSV, FLU A&B, COVID)  RVPGX2  CULTURE, BLOOD (ROUTINE X 2)  CULTURE, BLOOD (ROUTINE X 2)  MRSA NEXT GEN BY PCR, NASAL  PROTIME-INR  TSH  URINALYSIS, W/ REFLEX TO CULTURE (INFECTION SUSPECTED)     EKG  ED ECG REPORT I, Lind Repine, the attending physician, personally viewed and interpreted this ECG.  Date: 07/20/2023 EKG Time: 1111 Rate: 96 Rhythm: normal sinus rhythm QRS Axis: normal Intervals: normal ST/T  Wave abnormalities: LVH Narrative Interpretation: no evidence of acute ischemia    RADIOLOGY  Chest x-ray: I independently viewed and interpreted the images; there is significant opacity throughout the left lung concerning for effusion  PROCEDURES:  Critical Care performed: Yes, see critical care procedure note(s)  .Critical Care  Performed by: Lind Repine, MD Authorized by: Lind Repine, MD   Critical care provider statement:    Critical care time (minutes):  30   Critical care time was exclusive of:  Separately billable procedures and treating other patients   Critical care was necessary to treat or prevent imminent or life-threatening deterioration of the following conditions:  Respiratory failure and sepsis   Critical care was time spent personally by me on the following activities:  Development of treatment plan with patient or surrogate, discussions with consultants, evaluation of patient's response to treatment, examination of patient, ordering and review of laboratory studies, ordering and review of radiographic studies, ordering and performing treatments and interventions, pulse oximetry, re-evaluation of patient's condition, review of old charts and obtaining history from patient or surrogate   Care discussed with: admitting provider      MEDICATIONS ORDERED IN ED: Medications  azithromycin (ZITHROMAX) 500 mg in sodium chloride  0.9 % 250 mL IVPB (500 mg Intravenous New Bag/Given 07/20/23 1354)  acetaminophen  (TYLENOL ) tablet 650 mg (has no administration in time range)    Or  acetaminophen  (TYLENOL ) suppository 650 mg (has no administration in time range)  ondansetron  (ZOFRAN ) tablet 4 mg (has no administration in time range)    Or  ondansetron  (ZOFRAN ) injection 4 mg (has no administration in time range)  heparin  injection 5,000 Units (has no administration in time range)  lactated ringers infusion (has no administration in time range)  cefTRIAXone  (ROCEPHIN) 2 g in sodium chloride  0.9 % 100 mL IVPB (has no administration in time range)  azithromycin (ZITHROMAX) 500 mg in sodium chloride  0.9 % 250 mL IVPB (has no administration in time range)  senna-docusate (Senokot-S) tablet 1 tablet (has no administration in time range)  hydrALAZINE  (APRESOLINE ) injection 5 mg (has no administration in time range)  ipratropium-albuterol  (DUONEB) 0.5-2.5 (3) MG/3ML nebulizer solution 3 mL (has no administration in time range)  metoprolol tartrate (LOPRESSOR) injection 5 mg (has no administration in time range)  vancomycin (VANCOCIN) IVPB 1000 mg/200 mL premix (has no administration in time range)  lactated ringers bolus  500 mL (0 mLs Intravenous Stopped 07/20/23 1346)  cefTRIAXone (ROCEPHIN) 2 g in sodium chloride  0.9 % 100 mL IVPB (0 g Intravenous Stopped 07/20/23 1356)  lactated ringers bolus 1,000 mL (1,000 mLs Intravenous New Bag/Given 07/20/23 1356)     IMPRESSION / MDM / ASSESSMENT AND PLAN / ED COURSE  I reviewed the triage vital signs and the nursing notes.  83 year old female with PMH as noted above presents with acute onset of respiratory distress today.  Per EMS, the patient was hypoxic to as low as the 60s in the field and is now on a nonrebreather.  The patient has increased work of breathing and accessory muscle use.  She is alert but nonverbal which appears to be her baseline.  Neurologic exam is nonfocal.  Per EMS there was a strong urine smell in the room.  Differential diagnosis includes, but is not limited to, acute infection/sepsis, aspiration pneumonia, UTI, COPD exacerbation, bacterial pneumonia, COVID, influenza, other viral syndrome, dehydration, AKI, electrolyte abnormality, other metabolic disturbance, less likely cardiac etiology.  We will obtain chest x-ray, lab workup, placed the patient on BiPAP, and reassess.  Patient's presentation is most consistent with acute presentation with potential threat to life or bodily  function.  The patient is on the cardiac monitor to evaluate for evidence of arrhythmia and/or significant heart rate changes.   ----------------------------------------- 1:51 PM on 07/20/2023 -----------------------------------------  Chest x-ray shows significant opacity throughout the left lung.  VBG shows hypercapnia and acidosis.  Lab workup is also significant for WBC count of 16 and elevated lactate concerning for acute infection/sepsis.  Troponin is also significantly elevated.  Given the lack of EKG findings, this is most likely demand ischemia.  I have ordered empiric IV antibiotics.  The patient is maintaining a normal O2 saturation and improved respiratory effort on the BiPAP.  I contacted the daughter [name] over the phone and updated her on the patient's clinical status and guarded prognosis.  I also confirmed with her that the patient is DNR/DNI.  I then consulted Dr. Reinhold Carbine from the hospitalist service; based on our discussion she agrees to evaluate the patient for admission.   FINAL CLINICAL IMPRESSION(S) / ED DIAGNOSES   Final diagnoses:  Acute respiratory failure with hypoxia and hypercapnia (HCC)  Sepsis, due to unspecified organism, unspecified whether acute organ dysfunction present (HCC)  Pleural effusion     Rx / DC Orders   ED Discharge Orders     None        Note:  This document was prepared using Dragon voice recognition software and may include unintentional dictation errors.    Lind Repine, MD 07/20/23 [phone]

## 2023-07-20 NOTE — Consult Note (Signed)
 Pharmacy Antibiotic Note  Kelly Pugh is a 83 y.o. female admitted on 07/20/2023 with pneumonia. Pharmacy has been consulted for vancomycin dosing.  Today, 07/20/2023:  Day 1 of vancomycin  WBC 16.0  Afebrile  SCr 1.92 today with estimated CrCl of 17.7 mL/min Appears to be at baseline Antibiotics received during admission thus far   Plan:  Give vancomycin 1 gm x 1 dose as a loading dose, followed by vancomycin 750 mg IV Q48H Goal AUC 400-550  Estimated AUC 532.6 Estimated Cmin 13.8  SCr used 1.92  TBW, Vd 0.72   F/u MRSA nares to de-escalate if appropriate  Pharmacy will continue to monitor and dose adjust appropriately   Height: 5\' 3"  (160 cm) Weight: 51.3 kg (113 lb 1.5 oz) IBW/kg (Calculated) : 52.4  Temp (24hrs), Avg:93.1 F (33.9 C), Min:93.1 F (33.9 C), Max:93.1 F (33.9 C)  Recent Labs  Lab 07/20/23 1148  WBC 16.0*  CREATININE 1.92*  LATICACIDVEN 3.8*    Estimated Creatinine Clearance: 17.7 mL/min (A) (by C-G formula based on SCr of 1.92 mg/dL (H)).    Allergies  Allergen Reactions   Hydrocodone-Acetaminophen  Nausea Only   Oxycodone-Acetaminophen  Nausea Only   Antimicrobials this admission: 5/31 azithromycin >>  5/31 ceftriaxone >>  5/31 vancomycin >>   Dose adjustments this admission:  Microbiology results: 5/31 BCx: sent 5/31 MRSA PCR: to be collected     Thank you for allowing pharmacy to be a part of this patient's care.  Pansy Bogus, PharmD Pharmacy Resident  07/20/2023 2:28 PM

## 2023-07-20 NOTE — ED Notes (Signed)
 Lab at bedside

## 2023-07-20 NOTE — Assessment & Plan Note (Signed)
 -   At baseline -Monitor renal function -Avoid nephrotoxins

## 2023-07-20 NOTE — Hospital Course (Signed)
 [name] is an 83 year old female with history of dementia, COPD, hypertension, CKD, non-insulin -dependent diabetes mellitus type 2, hyperlipidemia, who presents emergency department for chief concerns of respiratory distress.  Vitals in the ED showed t 93.1, rr 23, heart rate 91, blood pressure 177/82, SpO2 95% on 2 L nasal cannula and then patient was placed on BiPAP therapy, VBG showed 7.13/74/39. Serum sodium is 139, potassium 5.8, chloride 108, bicarb 23, BUN 47, serum creatinine 1.92, EGFR 25, nonfasting blood glucose 88, WBC 16, hemoglobin 8.9, platelets of 325. BNP is 716.4.  High since troponin is 1140.  Lactic acid 3.8.  Blood cultures x 2 are in process.  Chest x-ray with large left sided effusion.  Pulmonary was consulted.  Patient was admitted with severe sepsis likely secondary to pneumonia.  Started on antibiotics.  6/1: Blood pressure elevated , still mildly tachypneic and remained on BiPAP.  POC ultrasound with fully collapsed left lower lobe with hepatization consistent with consolidation and pneumonia there was no pleural effusion or loculation.  There was also concern of aspiration or mucous plugging resulted in complete left lower lobe collapse.  Patient is DNR and does not want to be intubated for the purpose because of flexible bronchoscopy.  MRSA PCR negative so vancomycin was discontinued.  Preliminary blood cultures negative in 24 hours. CBC with worsening leukocytosis at 26.2-ceftriaxone is being switched with cefepime.  BMP with mild hyperkalemia with potassium at 5.5, CO2 of 21, some improvement in renal function to 1.84.  Troponin at 1253. Echocardiogram pending.  Patient appears very lethargic and poor underlying functional status-very high risk for mortality.  Palliative care was also consulted to discuss goals of care.  6/2: Mildly elevated blood pressure but overall improved vital, now saturating well on room air.  Improving leukocytosis, stable renal  function. Echocardiogram with normal EF, grade 1 diastolic dysfunction and elevated pulmonary arterial pressure.  6/3: Hemodynamically stable but blood pressure remained elevated, patient otherwise at her baseline.  Eating and drinking with help of her daughter.  She was instructed to restart taking her home amlodipine  as it was recently discontinued.  She will continue with current dose of losartan-losartan dose was not increased due to concern of hyperkalemia on admission.  Patient did receive stress doses of steroid which were not continued.  Patient remained on room air without any difficulty.  Upper respiratory symptoms has been improved.  Patient likely have demand ischemia with elevated troponin and unable to explain any symptoms due to advanced dementia.    Patient will resume her hospice help at home on discharge.  She is being discharged on 3 more days of Augmentin  and 2 days of Zithromax.  She will continue the rest of her home medications and follow-up with her providers for further assistance.

## 2023-07-20 NOTE — ED Notes (Signed)
 This RN attempted to in and out cath pt. Pt had no urine output.

## 2023-07-20 NOTE — ED Notes (Signed)
 This RN gave report to ICU nurse

## 2023-07-20 NOTE — Consult Note (Signed)
 CODE SEPSIS - PHARMACY COMMUNICATION  **Broad Spectrum Antibiotics should be administered within 1 hour of Sepsis diagnosis**  Time Code Sepsis Called/Page Received: 1351  Antibiotics Ordered: Ceftriaxone & Azithromycin   Time of 1st antibiotic administration: 1256  Additional action taken by pharmacy: N/A  If necessary, Name of Provider/Nurse Contacted: N/A  Kelly Pugh, PharmD Pharmacy Resident  07/20/2023 1:57 PM

## 2023-07-20 NOTE — Assessment & Plan Note (Signed)
 Severe, with hypercapnia suspect secondary to community-acquired pneumonia Continue BiPAP therapy Check MRSA PCR Blood cultures x 2 are in process Continue with azithromycin 500 mg daily, ceftriaxone 2 g IV to complete a 5-day course Added vancomycin per pharmacy

## 2023-07-20 NOTE — Assessment & Plan Note (Addendum)
 Home atorvastatin 10 mg daily not resumed on admission as patient is n.p.o.

## 2023-07-20 NOTE — Plan of Care (Signed)
  Problem: Fluid Volume: Goal: Hemodynamic stability will improve Outcome: Progressing   Problem: Clinical Measurements: Goal: Diagnostic test results will improve Outcome: Progressing Goal: Signs and symptoms of infection will decrease Outcome: Progressing   Problem: Clinical Measurements: Goal: Cardiovascular complication will be avoided Outcome: Progressing   Problem: Skin Integrity: Goal: Risk for impaired skin integrity will decrease Outcome: Progressing

## 2023-07-20 NOTE — H&P (Addendum)
 History and Physical   Kelly Pugh EXB:[id number] DOB: 02-Jan-1941 DOA: 07/20/2023  PCP: Kelly Flowers, MD  Patient coming from: Home via EMS  I have personally briefly reviewed patient's old medical records in [facility] EMR.  Chief Concern: Respiratory distress  HPI: [name] is an 83 year old female with history of dementia, COPD, hypertension, CKD, non-insulin -dependent diabetes mellitus type 2, hyperlipidemia, who presents emergency department for chief concerns of respiratory distress.  Vitals in the ED showed t 93.1, rr 23, heart rate 91, blood pressure 177/82, SpO2 95% on 2 L nasal cannula and then patient was placed on BiPAP therapy  VBG showed 7.13/74/39.  Serum sodium is 139, potassium 5.8, chloride 108, bicarb 23, BUN 47, serum creatinine 1.92, EGFR 25, nonfasting blood glucose 88, WBC 16, hemoglobin 8.9, platelets of 325.  BNP is 716.4.  High since troponin is 1140.  Lactic acid 3.8.  Blood cultures x 2 are in process.  ED treatment: Azithromycin 500 mg IV one-time dose, ceftriaxone 2 g IV one-time dose, LR 1 L bolus, LR 500 mL liter bolus. --------------------------------------- At bedside, patient nonverbal and laying on the bed.  She does not follow commands.  Per daughter, this is patient's baseline.  She is contracted in the upper extremities.  Per daughter, she has stopped talking about 2 years ago due to dementia.  Patient does open her eyes when she is being moved.  Social history: Lives at home.  ROS: To complete as patient is nonverbal at baseline  ED Course: Discussed with EDP, patient requiring hospitalization for chief concerns of acute hypoxic respiratory failure requiring BiPAP therapy.  Assessment/Plan  Principal Problem:   Acute hypoxic respiratory failure (HCC) Active Problems:   HTN (hypertension)   Chronic kidney disease, stage 4 (severe) (HCC)   Hyperlipidemia, mixed   Malignant neoplasm of upper-outer quadrant of  right breast in female, estrogen receptor positive (HCC)   Type II diabetes mellitus (HCC)   Dementia, old age, without behavioral disturbance (HCC)   COPD    Severe sepsis with acute organ dysfunction (HCC)   Assessment and Plan:  * Acute hypoxic respiratory failure (HCC) Severe, with hypercapnia suspect secondary to community-acquired pneumonia Continue BiPAP therapy Check MRSA PCR Blood cultures x 2 are in process Continue with azithromycin 500 mg daily, ceftriaxone 2 g IV to complete a 5-day course Added vancomycin per pharmacy  Severe sepsis with acute organ dysfunction (HCC) Hypothermia Lactic acid 3.8, markedly elevated leukocytosis Organ involvement are cardiac and pulmonary Etiology is likely multifactorial including commune acquired pneumonia versus UTI UA has been ordered and pending collection Strict I's and O's Check TSH, MRSA PCR Blood cultures x 2 are in process Continue with azithromycin, ceftriaxone to complete a 5-day course Added vancomycin Maintain MAP greater than 65 Continue BiPAP therapy, continuous pulse oximetry, continue Bair hugger Admit to stepdown  COPD  Continuous pulse oximetry DuoNebs every 6 hours as needed for shortness of breath and wheezing, 3 days ordered  Hyperlipidemia, mixed Home atorvastatin 10 mg daily not resumed on admission as patient is n.p.o.  Chronic kidney disease, stage 4 (severe) (HCC) At base line  HTN (hypertension) Home carvedilol  6.25 mg p.o. twice daily, losartan 25 mg daily, amlodipine  10 mg daily were not resumed on admission as patient is n.p.o. AM team to resume when the benefits outweigh the risk Hydralazine  5 mg IV every 6 hours as needed for SBP greater than 170, 5 days ordered  Chart reviewed.   DVT prophylaxis: Heparin  5000  units subcutaneous every 8 hours Code Status: DNR/DNI Diet: N.p.o. Family Communication: Discussed with daughter, Kelly Pugh over the phone at [phone] Disposition Plan:  Pending clinical course; poor prognosis Consults called: Pulmonologist Admission status: Stepdown, inpatient  Past Medical History:  Diagnosis Date   Cervical cancer [facility])    Past Surgical History:  Procedure Laterality Date   BREAST LUMPECTOMY     Social History:  reports that she has quit smoking. Her smoking use included cigarettes. She has never used smokeless tobacco. She reports that she does not drink alcohol and does not use drugs.  Allergies  Allergen Reactions   Hydrocodone-Acetaminophen  Nausea Only   Oxycodone-Acetaminophen  Nausea Only   Family History  Problem Relation Age of Onset   Diabetes Sister    Stroke Sister    Dementia Sister    Family history: Family history reviewed and not pertinent.  Prior to Admission medications   Medication Sig Start Date End Date Taking? Authorizing Provider  albuterol  (VENTOLIN  HFA) 108 (90 Base) MCG/ACT inhaler Inhale 2 puffs into the lungs every 6 (six) hours as needed for wheezing.   Yes [provider]  atorvastatin (LIPITOR) 10 MG tablet Take 1 tablet by mouth daily. 08/02/22  Yes [provider]  carvedilol  (COREG ) 6.25 MG tablet Take 6.25 mg by mouth 2 (two) times daily. 10/03/21  Yes [provider]  donepezil  (ARICEPT ) 5 MG tablet Take 5 mg by mouth at bedtime. 07/19/21  Yes [provider]  latanoprost  (XALATAN ) 0.005 % ophthalmic solution Place 1 drop into both eyes at bedtime. 04/29/19  Yes [provider]  losartan (COZAAR) 25 MG tablet Take 1 tablet by mouth daily. 06/17/23  Yes [provider]  acetaminophen  (TYLENOL ) 650 MG CR tablet Take 650 mg by mouth every 8 (eight) hours as needed for pain.    [provider]  amLODipine  (NORVASC ) 10 MG tablet Take 1 tablet by mouth daily. Patient not taking: Reported on 07/20/2023 02/19/23   [provider]  Cholecalciferol (VITAMIN D-1000 MAX ST) 25 MCG (1000 UT) tablet Take 1,000 Units by mouth daily. Patient  not taking: Reported on 07/20/2023 06/14/21   [provider]  cyanocobalamin (VITAMIN B12) 1000 MCG tablet Take 1,000 mcg by mouth daily. Patient not taking: Reported on 07/20/2023 01/21/20   [provider]   Physical Exam: Vitals:   07/20/23 1111 07/20/23 1130 07/20/23 1226 07/20/23 1330  BP:  (!) 177/82  (!) 141/115  Pulse:  91  84  Resp:  (!) 23  (!) 26  Temp:   (!) 93.1 F (33.9 C)   TempSrc:   Rectal   SpO2:  95%  99%  Weight: 51.3 kg     Height: 5\' 3"  (1.6 m)      Constitutional: appears frail, acutely ill, cachectic Eyes: PERRL in the right eye, unable to assess left eye as patient will not open it ENMT: Mucous membranes are dry.  Unable to assess posterior pharynx, dentition, hearing. Neck: normal, supple, no masses, no thyromegaly Respiratory: clear to auscultation bilaterally, no wheezing, no crackles. Normal respiratory effort. No accessory muscle use.  Cardiovascular: Regular rate and rhythm, no murmurs / rubs / gallops. No extremity edema. 2+ pedal pulses. No carotid bruits.  Abdomen: no tenderness, no masses palpated, no hepatosplenomegaly. Bowel sounds positive.  Musculoskeletal: no clubbing / cyanosis. No joint deformity upper and lower extremities.  No ROM, + extremities contractures, + atrophy. Normal muscle tone.  Skin: no rashes, lesions, ulcers. No induration Neurologic: Sensation  intact. Strength 5/5 in all 4.  Psychiatric: Lacks judgment, insight, alertness, orientation.  Unable to assess mood.    EKG: independently reviewed, showing sinus rhythm with rate of 96, QTc 471  Chest x-ray on Admission: I personally reviewed and I agree with radiologist reading as below.  DG Chest Port 1 View Result Date: 07/20/2023 CLINICAL DATA:  Hypoxia EXAM: PORTABLE CHEST 1 VIEW COMPARISON:  10/19/2021. FINDINGS: Nearly completely opacified left hemithorax with suggestion of a large left-sided pleural effusion. Cannot exclude underlying lesions, and follow up is  recommended. Postop changes right mastectomy with axillary dissection. Osseous structures appear grossly intact. IMPRESSION: Nearly completely opacified left hemithorax. Suspect large left-sided effusion. Follow-up recommended. Electronically Signed   By: Sydell Eva M.D.   On: 07/20/2023 12:01   Labs on Admission: I have personally reviewed following labs  CBC: Recent Labs  Lab 07/20/23 1148  WBC 16.0*  NEUTROABS 14.6*  HGB 8.9*  HCT 27.8*  MCV 91.1  PLT 325   Basic Metabolic Panel: Recent Labs  Lab 07/20/23 1148  NA 139  K 5.8*  CL 108  CO2 23  GLUCOSE 88  BUN 47*  CREATININE 1.92*  CALCIUM 8.8*   GFR: Estimated Creatinine Clearance: 17.7 mL/min (A) (by C-G formula based on SCr of 1.92 mg/dL (H)).  Liver Function Tests: Recent Labs  Lab 07/20/23 1148  AST 37  ALT 24  ALKPHOS 49  BILITOT 1.0  PROT 6.8  ALBUMIN 2.8*   Coagulation Profile: Recent Labs  Lab 07/20/23 1148  INR 1.1   Urine analysis:    Component Value Date/Time   COLORURINE STRAW (A) 10/21/2021 0330   APPEARANCEUR CLEAR (A) 10/21/2021 0330   APPEARANCEUR CLOUDY 02/09/2013 1541   LABSPEC 1.014 10/21/2021 0330   LABSPEC 1.010 02/09/2013 1541   PHURINE 5.0 10/21/2021 0330   GLUCOSEU NEGATIVE 10/21/2021 0330   GLUCOSEU NEGATIVE 02/09/2013 1541   HGBUR NEGATIVE 10/21/2021 0330   BILIRUBINUR NEGATIVE 10/21/2021 0330   BILIRUBINUR SEE COMMENT 02/09/2013 1541   KETONESUR NEGATIVE 10/21/2021 0330   PROTEINUR 30 (A) 10/21/2021 0330   NITRITE NEGATIVE 10/21/2021 0330   LEUKOCYTESUR NEGATIVE 10/21/2021 0330   LEUKOCYTESUR 3+ 02/09/2013 1541   CRITICAL CARE Performed by: Dr. Reinhold Carbine  Total critical care time: 32 minutes  Critical care time was exclusive of separately billable procedures and treating other patients.  Critical care was necessary to treat or prevent imminent or life-threatening deterioration.  Critical care was time spent personally by me on the following activities:  development of treatment plan with daughter, [name], as well as nursing, discussions with consultants, evaluation of patient's response to treatment, examination of patient, obtaining history from patient or surrogate, ordering and performing treatments and interventions, ordering and review of laboratory studies, ordering and review of radiographic studies, pulse oximetry and re-evaluation of patient's condition.  This document was prepared using Dragon Voice Recognition software and may include unintentional dictation errors.  Dr. Reinhold Carbine [facility]  If 7PM-7AM, please contact overnight-coverage provider If 7AM-7PM, please contact day attending provider [email or web address]  07/20/2023, 2:42 PM

## 2023-07-20 NOTE — ED Triage Notes (Signed)
 Pt BIB [facility] from home due to family calling out for possible respiratory distress. Ems reports a foul urine odor emitting from pt. Hx dementia, bed bound and nonverbal at baseline. HX of asthma. Pt sat at 69% RA and placed on 10L NRB @94 %.  100 HR 160/80 30 RR 109 CBG

## 2023-07-21 ENCOUNTER — Inpatient Hospital Stay (HOSPITAL_COMMUNITY)
Admit: 2023-07-21 | Discharge: 2023-07-21 | Disposition: A | Discharge status: Home / Self Care | Attending: Internal Medicine | Admitting: Internal Medicine

## 2023-07-21 DIAGNOSIS — J441 Chronic obstructive pulmonary disease with (acute) exacerbation: Secondary | ICD-10-CM

## 2023-07-21 DIAGNOSIS — Z66 Do not resuscitate: Secondary | ICD-10-CM | POA: Diagnosis not present

## 2023-07-21 DIAGNOSIS — I10 Essential (primary) hypertension: Secondary | ICD-10-CM

## 2023-07-21 DIAGNOSIS — Z789 Other specified health status: Secondary | ICD-10-CM

## 2023-07-21 DIAGNOSIS — R6520 Severe sepsis without septic shock: Secondary | ICD-10-CM

## 2023-07-21 DIAGNOSIS — Z7189 Other specified counseling: Secondary | ICD-10-CM

## 2023-07-21 DIAGNOSIS — R7989 Other specified abnormal findings of blood chemistry: Secondary | ICD-10-CM

## 2023-07-21 DIAGNOSIS — J9601 Acute respiratory failure with hypoxia: Secondary | ICD-10-CM

## 2023-07-21 DIAGNOSIS — R9431 Abnormal electrocardiogram [ECG] [EKG]: Secondary | ICD-10-CM

## 2023-07-21 DIAGNOSIS — E782 Mixed hyperlipidemia: Secondary | ICD-10-CM

## 2023-07-21 DIAGNOSIS — F0390 Unspecified dementia without behavioral disturbance: Secondary | ICD-10-CM

## 2023-07-21 DIAGNOSIS — N184 Chronic kidney disease, stage 4 (severe): Secondary | ICD-10-CM

## 2023-07-21 DIAGNOSIS — J90 Pleural effusion, not elsewhere classified: Secondary | ICD-10-CM

## 2023-07-21 DIAGNOSIS — A419 Sepsis, unspecified organism: Secondary | ICD-10-CM | POA: Diagnosis not present

## 2023-07-21 DIAGNOSIS — Z515 Encounter for palliative care: Secondary | ICD-10-CM | POA: Diagnosis not present

## 2023-07-21 DIAGNOSIS — J9602 Acute respiratory failure with hypercapnia: Secondary | ICD-10-CM

## 2023-07-21 DIAGNOSIS — J189 Pneumonia, unspecified organism: Secondary | ICD-10-CM

## 2023-07-21 LAB — URINALYSIS, W/ REFLEX TO CULTURE (INFECTION SUSPECTED)
Bacteria, UA: NONE SEEN
Bilirubin Urine: NEGATIVE
Glucose, UA: NEGATIVE mg/dL
Ketones, ur: NEGATIVE mg/dL
Leukocytes,Ua: NEGATIVE
Nitrite: NEGATIVE
Protein, ur: >=300 mg/dL — AB
Specific Gravity, Urine: 1.012 (ref 1.005–1.030)
pH: 5 (ref 5.0–8.0)

## 2023-07-21 LAB — BLOOD GAS, VENOUS
Acid-base deficit: 1.1 mmol/L (ref 0.0–2.0)
Bicarbonate: 24.8 mmol/L (ref 20.0–28.0)
O2 Saturation: 74 %
Patient temperature: 37
pCO2, Ven: 45 mmHg (ref 44–60)
pH, Ven: 7.35 (ref 7.25–7.43)
pO2, Ven: 45 mmHg (ref 32–45)

## 2023-07-21 LAB — LACTIC ACID, PLASMA
Lactic Acid, Venous: 1.8 mmol/L (ref 0.5–1.9)
Lactic Acid, Venous: 1.9 mmol/L (ref 0.5–1.9)

## 2023-07-21 LAB — BASIC METABOLIC PANEL WITH GFR
Anion gap: 10 (ref 5–15)
BUN: 45 mg/dL — ABNORMAL HIGH (ref 8–23)
CO2: 21 mmol/L — ABNORMAL LOW (ref 22–32)
Calcium: 8.6 mg/dL — ABNORMAL LOW (ref 8.9–10.3)
Chloride: 109 mmol/L (ref 98–111)
Creatinine, Ser: 1.84 mg/dL — ABNORMAL HIGH (ref 0.44–1.00)
GFR, Estimated: 27 mL/min — ABNORMAL LOW (ref >60)
Glucose, Bld: 117 mg/dL — ABNORMAL HIGH (ref 70–99)
Potassium: 5.5 mmol/L — ABNORMAL HIGH (ref 3.5–5.1)
Sodium: 140 mmol/L (ref 135–145)

## 2023-07-21 LAB — STREP PNEUMONIAE URINARY ANTIGEN: Strep Pneumo Urinary Antigen: NEGATIVE

## 2023-07-21 LAB — CBC
HCT: 27.1 % — ABNORMAL LOW (ref 36.0–46.0)
Hemoglobin: 8.8 g/dL — ABNORMAL LOW (ref 12.0–15.0)
MCH: 27.8 pg (ref 26.0–34.0)
MCHC: 32.5 g/dL (ref 30.0–36.0)
MCV: 85.5 fL (ref 80.0–100.0)
Platelets: 244 10*3/uL (ref 150–400)
RBC: 3.17 MIL/uL — ABNORMAL LOW (ref 3.87–5.11)
RDW: 13.7 % (ref 11.5–15.5)
WBC: 26.2 10*3/uL — ABNORMAL HIGH (ref 4.0–10.5)
nRBC: 0 % (ref 0.0–0.2)

## 2023-07-21 LAB — TROPONIN I (HIGH SENSITIVITY)
Troponin I (High Sensitivity): 1081 ng/L (ref <18)
Troponin I (High Sensitivity): 1031 ng/L (ref <18)

## 2023-07-21 MED ORDER — HYDRALAZINE HCL 20 MG/ML IJ SOLN
10.0000 mg | INTRAMUSCULAR | Status: DC | PRN
  Administered 2023-07-21 – 2023-07-23 (×8): 10 mg via INTRAVENOUS
  Filled 2023-07-21 (×8): qty 1

## 2023-07-21 MED ORDER — ORAL CARE MOUTH RINSE
15.0000 mL | OROMUCOSAL | Status: DC
  Administered 2023-07-21 – 2023-07-23 (×6): 15 mL via OROMUCOSAL

## 2023-07-21 MED ORDER — ORAL CARE MOUTH RINSE: 15.0000 mL | OROMUCOSAL | Status: DC | PRN

## 2023-07-21 MED ORDER — CHLORHEXIDINE GLUCONATE CLOTH 2 % EX PADS
6.0000 | MEDICATED_PAD | Freq: Every day | CUTANEOUS | Status: DC
  Administered 2023-07-21 – 2023-07-23 (×2): 6 via TOPICAL

## 2023-07-21 MED ORDER — HYDRALAZINE HCL 20 MG/ML IJ SOLN
10.0000 mg | Freq: Once | INTRAMUSCULAR | Status: AC
  Administered 2023-07-21: 10 mg via INTRAVENOUS
  Filled 2023-07-21: qty 1

## 2023-07-21 MED ORDER — SODIUM CHLORIDE 0.9 % IV SOLN
2.0000 g | INTRAVENOUS | Status: DC
  Administered 2023-07-21 – 2023-07-23 (×3): 2 g via INTRAVENOUS
  Filled 2023-07-21 (×3): qty 12.5

## 2023-07-21 NOTE — Progress Notes (Signed)
 Progress Note   Patient: Kelly Pugh ZOX:[id number] DOB: [birth year] DOA: 07/20/2023     1 DOS: the patient was seen and examined on 07/21/2023   Brief hospital course: [name] is an 83 year old female with history of dementia, COPD, hypertension, CKD, non-insulin -dependent diabetes mellitus type 2, hyperlipidemia, who presents emergency department for chief concerns of respiratory distress.  Vitals in the ED showed t 93.1, rr 23, heart rate 91, blood pressure 177/82, SpO2 95% on 2 L nasal cannula and then patient was placed on BiPAP therapy, VBG showed 7.13/74/39. Serum sodium is 139, potassium 5.8, chloride 108, bicarb 23, BUN 47, serum creatinine 1.92, EGFR 25, nonfasting blood glucose 88, WBC 16, hemoglobin 8.9, platelets of 325. BNP is 716.4.  High since troponin is 1140.  Lactic acid 3.8.  Blood cultures x 2 are in process.  Chest x-ray with large left sided effusion.  Pulmonary was consulted.  Patient was admitted with severe sepsis likely secondary to pneumonia.  Started on antibiotics.  6/1: Blood pressure elevated , still mildly tachypneic and remained on BiPAP.  POC ultrasound with fully collapsed left lower lobe with hepatization consistent with consolidation and pneumonia there was no pleural effusion or loculation.  There was also concern of aspiration or mucous plugging resulted in complete left lower lobe collapse.  Patient is DNR and does not want to be intubated for the purpose because of flexible bronchoscopy.  MRSA PCR negative so vancomycin was discontinued.  Preliminary blood cultures negative in 24 hours. CBC with worsening leukocytosis at 26.2-ceftriaxone is being switched with cefepime.  BMP with mild hyperkalemia with potassium at 5.5, CO2 of 21, some improvement in renal function to 1.84.  Troponin at 1253. Echocardiogram pending.  Patient appears very lethargic and poor underlying functional status-very high risk for mortality.  Palliative care was also  consulted to discuss goals of care.  Assessment and Plan: * Acute hypoxic respiratory failure (HCC) Pneumonia with collapse of left lower lung lobe Severe, with hypercapnia suspect secondary to community-acquired pneumonia, MRSA PCR negative, preliminary blood cultures negative-sputum culture ordered. Lactic acidosis resolved.  Worsening leukocytosis Continue BiPAP therapy - Broadening antibiotics to cefepime -Continue with azithromycin 500 mg daily,  - Vancomycin was discontinued -Continue with supportive care  Severe sepsis with acute organ dysfunction (HCC) Hypothermia Lactic acid 3.8, markedly elevated leukocytosis Organ involvement are cardiac and pulmonary Etiology is likely multifactorial including commune acquired pneumonia versus UTI UA has been ordered and pending collection-nurse was reminded to send urine Strict I's and O's Continue BiPAP therapy, continuous pulse oximetry, continue Bair hugger Continue with antibiotics as mentioned above  Elevated troponin Concern of NSTEMI, troponin peaked at 1282 and now trending down. Unable to explain any chest pain Echocardiogram pending - Has not started on heparin  infusion-pending goals of care discussion. -Supportive care  COPD  No wheezing. Continuous pulse oximetry DuoNebs every 6 hours as needed for shortness of breath and wheezing,  HTN (hypertension) Elevated blood pressure Home carvedilol  6.25 mg p.o. twice daily, losartan 25 mg daily, amlodipine  10 mg daily were not resumed on admission as patient is n.p.o. -Continue with IV hydralazine -dose was increased  Chronic kidney disease, stage 4 (severe) (HCC) At base line - Monitor renal function -Avoid nephrotoxins  Hyperlipidemia, mixed Home atorvastatin 10 mg daily not resumed on admission as patient is n.p.o.  Dementia, old age, without behavioral disturbance [facility]) Patient with very poor underlying functional status and cachectic. - Palliative care consult -  Holding home Aricept  as she is  n.p.o.   Subjective: Patient was barely opening her eyes when called her name, not following any other commands.  Remained with BiPAP.  Physical Exam: Vitals:   07/21/23 0900 07/21/23 1000 07/21/23 1100 07/21/23 1129  BP: (!) 161/71 (!) 163/62 (!) 170/67   Pulse: 88 88 87 88  Resp: (!) 26 (!) 22 (!) 22 20  Temp: 98.2 F (36.8 C) 97.9 F (36.6 C) 97.7 F (36.5 C)   TempSrc:      SpO2: 100% 100% 98% 100%  Weight:      Height:       General.  Ill-appearing, cachectic elderly lady, in no acute distress.  BiPAP in place Pulmonary.  Bronchial sounds on left, mildly increased work of breathing CV.  Regular rate and rhythm, mild sinus tachycardia Abdomen.  Soft, nontender, nondistended, BS positive. CNS.  Lethargic, not following commands Extremities.  No edema,  pulses intact and symmetrical.  Data Reviewed: Prior data reviewed  Family Communication: Talked with daughter and sister on phone.  Disposition: Status is: Inpatient Remains inpatient appropriate because: Severity of illness  Planned Discharge Destination: To be determined  DVT prophylaxis.  Subcu heparin  Time spent: 55 minutes  This record has been created using Conservation officer, historic buildings. Errors have been sought and corrected,but may not always be located. Such creation errors do not reflect on the standard of care.   Author: Luna Salinas, MD 07/21/2023 1:27 PM  For on call review [email or web address].

## 2023-07-21 NOTE — Assessment & Plan Note (Signed)
 Concern of NSTEMI, troponin peaked at 1282 and now trending down. Unable to explain any chest pain Echocardiogram pending - Has not started on heparin  infusion-pending goals of care discussion. -Supportive care

## 2023-07-21 NOTE — Plan of Care (Signed)
  Problem: Fluid Volume: Goal: Hemodynamic stability will improve Outcome: Progressing   Problem: Clinical Measurements: Goal: Will remain free from infection Outcome: Progressing   Problem: Skin Integrity: Goal: Risk for impaired skin integrity will decrease Outcome: Progressing

## 2023-07-21 NOTE — Progress Notes (Signed)
 Dr. Amin gave order to transfer patient to progressive care unit.

## 2023-07-21 NOTE — TOC Initial Note (Signed)
 Transition of Care [facility]) - Initial/Assessment Note    Patient Details  Name: Kelly Pugh MRN: [id number] Date of Birth: Jan 05, 1941  Transition of Care [facility]) CM/SW Contact:    Crayton Docker, RN 07/21/2023, 1:41 PM  Clinical Narrative:                  Alert received from [facility], PT, therapy evaluation. Per Antonio Baumgarten, recommendation is hoyer lift. CM call to patient's daughter Judge Notice, phone: [phone] regarding TOC screening assessment. CM introduced case management role and discharge care planning process.  Patient's daughter, verbalized understanding and agreement with screening interview. Per patient's daughter, patient lives with patient's daughter, son in law and 2 minor grandchildren. Per patient's daughter, [facility] provides home hospice services. CM and patient's daughter discussed DME recommendation of hoyer lift. Per patient's daughter, declined hoyer lift. Per patient's daughter, had a previous hoyer lift and patient's daughter returned item. CM call to Lourena Royal, phone: [phone] regarding patient admission and patient daughter declination of hoyer lift. Per Alisa App, will continue to follow through hospital course.    Patient Goals and CMS Choice    Resumption of care/ Home with home hospice   Expected Discharge Plan and Services    Resumption of care/Home with home hospice.      Prior Living Arrangements/Services   Lives with:: Adult Children, Minor Children and son in law    Current home services: Hospice (hospital, mattress, home oxygen at 3LPM,)    Activities of Daily Living   ADL Screening (condition at time of admission) Independently performs ADLs?: No Does the patient have a NEW difficulty with bathing/dressing/toileting/self-feeding that is expected to last >3 days?: Yes (Initiates electronic notice to provider for possible OT consult) Does the patient have a NEW difficulty with getting in/out of bed, walking, or  climbing stairs that is expected to last >3 days?: Yes (Initiates electronic notice to provider for possible PT consult) Does the patient have a NEW difficulty with communication that is expected to last >3 days?: Yes (Initiates electronic notice to provider for possible SLP consult) Is the patient deaf or have difficulty hearing?: Yes Does the patient have difficulty seeing, even when wearing glasses/contacts?: Yes Does the patient have difficulty concentrating, remembering, or making decisions?: Yes  Permission Sought/Granted Permission sought to share information with : Family Supports    Share Information with NAME: Kayren Pass     Permission granted to share info w Relationship: Daughter     Emotional Assessment    Unable to assess  Admission diagnosis:  Pleural effusion [J90] Acute respiratory failure with hypoxia and hypercapnia (HCC) [J96.01, J96.02] Sepsis, due to unspecified organism, unspecified whether acute organ dysfunction present (HCC) [A41.9] Acute hypoxic respiratory failure (HCC) [J96.01] Patient Active Problem List   Diagnosis Date Noted   CAP (community acquired pneumonia) 07/21/2023   Elevated troponin 07/21/2023   Acute respiratory failure with hypoxia and hypercapnia (HCC) 07/20/2023   Severe sepsis with acute organ dysfunction (HCC) 07/20/2023   Aspiration pneumonia (HCC) 10/20/2021   Acute hypoxic respiratory failure (HCC) 10/20/2021   COPD  10/20/2021   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   Dementia, old age, without behavioral disturbance (HCC) 12/09/2019   Abnormal SPEP 08/19/2019   Normocytic anemia 08/19/2019   Type II diabetes mellitus (HCC) 08/19/2019   Chronic kidney disease, stage 4 (severe) (HCC) 03/06/2019   Abnormal ECG 04/04/2018   Central retinal vein occlusion, left eye, with macular edema 01/27/2018   Encounter for monitoring aromatase inhibitor  therapy 10/09/2017   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   Malignant  neoplasm of upper-outer quadrant of right breast in female, estrogen receptor positive (HCC) 10/23/2016   History of breast cancer 10/23/2016   Hyperlipidemia, mixed 01/14/2013   Personal history of ovarian cancer 12/12/2010   HTN (hypertension) 12/11/2010   Mild intermittent asthma without complication 12/11/2010   Chemotherapy follow-up examination 02/06/2010   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   PCP:  Devaughn Flowers, MD Pharmacy:   [facility] [address] [place] - [address] [place] [address] Phone: [phone] Fax: [phone]  [id number] [place] - [address] AT [facility] [address] [facility] [place] [address] Phone: [phone] Fax: [phone]  Social Drivers of Health (SDOH) Social History: SDOH Screenings   Food Insecurity: Patient Unable To Answer (07/20/2023)  Housing: Patient Unable To Answer (07/20/2023)  Transportation Needs: Patient Unable To Answer (07/20/2023)  Utilities: Patient Unable To Answer (07/20/2023)  Financial Resource Strain: Medium Risk (07/14/2021)   Received from [facility]  Physical Activity: Inactive (07/14/2021)   Received from [facility]  Social Connections: Patient Unable To Answer (07/20/2023)  Stress: No Stress Concern Present (01/03/2022)   Received from [facility]  Tobacco Use: Medium Risk (07/20/2023)   SDOH Interventions:     Readmission Risk Interventions     No data to display

## 2023-07-21 NOTE — Progress Notes (Signed)
 SLP Cancellation Note  Patient Details Name: Kelly Pugh MRN: [id number] DOB: [birth year]   Cancelled treatment:       Reason Eval/Treat Not Completed: Patient not medically ready. Cognitive-communication orders received. Pt currently on Bi-PAP and per nsg does not communicate verbally at baseline. If swallow evaluation is needed, please place orders, and SLP will follow up when medically appropriate.  Scheryl Cushing, [place], Sports administrator Office: [phone] ASCOM: [phone]    Luster Salters 07/21/2023, 10:34 AM

## 2023-07-21 NOTE — Consult Note (Addendum)
 Consultation Note Date: 07/21/2023 at 1115  Patient Name: Kelly Pugh  DOB: 01/08/1941  MRN: [id number]  Age / Sex: 83 y.o., female  PCP: Devaughn Flowers, MD Referring Physician: Luna Salinas, MD  HPI/Patient Profile: 83 y.o. female  with past medical history of dememtia, COPD, HTN, CKD, non-insulin  dependent DM II and HLD. Pt presented to ED from home 07/20/2023 via EMS with complaint of respiratory distress. EMS reports patient was 69% RA on scene, improved to 94% on 10L NRB on arrival.  In ED, daughter reports that patient is bed bound and has been non-verbal for approximately 2 years from her dementia.  ED workup showed VBG 7.13/74/39, Na+ 139, K+ 5.8, chloride 108, bicarb 23, BUN 47, creatinine 1.92, GFR 25, WBC 16, Hgb 8.9, platelets 325, lactic 3.8, BNP 716.4 and troponin 1140. BP 177/82, HR 91, RR 23, temp 93.1, 95% on 2 L, later transitioned to BiPAP.  CXR shows near complete opacified left hemithorax.   Patient was admitted to step-down unit for management of acute hypoxic respiratory failure and severe sepsis with cardiac and pulmonary organ dysfunction.   PMT was consulted for goals of care conversation.    Clinical Assessment and Goals of Care:  Elderly, ill-appearing female, fetal position in bed with BiPAP in place. She opens eyes but does not acknowledge my presence. She does not respond to verbal stimuli and does not follow commands. No respiratory distress noted.  Extensive chart review completed prior to meeting patient including labs, vital signs, imaging, progress notes, orders, and available advanced directive documents from current and previous encounters. I then called patients daughter, Kelly Pugh, to discuss diagnosis prognosis, GOC, EOL wishes, disposition and options.  I introduced Palliative Medicine as specialized medical care for people living with serious illness. It  focuses on providing relief from the symptoms and stress of a serious illness. The goal is to improve quality of life for both the patient and the family.  We discussed a brief life review of the patient. Ms. Moosman is a widow after being married to her husband >45 years. She has 3 children, 2 girls and 1 boy. She has 4 grandchildren and 3 great-grandchildren.Ms. Galgano worked in the [employer] for >40 years. She currently resides with her daughter, Kelly Pugh, and granddaughter who are her primary caregivers.   As far as functional and nutritional status, Ms. Arington is unable to perform any of her ADLs. She is bed bound and non-verbal at baseline. [facility] provides in-home care 3 days a week. Kelly Pugh reports that he mother has a good appetite eats pureed foods that are easily swallowed.   We discussed patient's current illness and what it means in the larger context of patient's on-going co-morbidities.  Natural disease trajectory and expectations at EOL were discussed.  The difference between aggressive medical intervention and comfort care was considered in light of the patient's goals of care. At this time, Kelly Pugh wants her mother to continue current treatment. Discussed new labs results today concerning that her mother  may not be responding to treatment. Kelly Pugh is accepting of this information and verbalizes understanding of comfort care but wants to re-evalute tomorrow.  Advance directives, concepts specific to code status and rehospitalization were considered and discussed. Confirmed with Kelly Pugh that her mother is a DNR/DNI status.   Education offered regarding concept specific to human mortality and the limitations of medical interventions to prolong life when the body begins to fail to thrive.  Family is facing treatment option decisions, advanced directive, and anticipatory care needs.    Discussed with patient/family the importance of continued conversation with  family and the medical providers regarding overall plan of care and treatment options, ensuring decisions are within the context of the patient's values and GOCs.    Questions and concerns were addressed. The family was encouraged to call with questions or concerns.   Primary Decision Maker NEXT OF Kelly Pugh, daughter  Physical Exam Vitals reviewed.  Constitutional:      General: She is not in acute distress.    Appearance: She is ill-appearing.     Comments: Cachetic, frail  HENT:     Head: Normocephalic and atraumatic.  Pulmonary:     Effort: Pulmonary effort is normal. No respiratory distress.     Comments: BiPAP in place Skin:    General: Skin is warm and dry.   Recommendations/Plan: Continue DNR/DNI status as previously documented    Continue current supportive interventions Palliative to follow for ongoing goals of care conversation Overall poor prognosis   Palliative Assessment/Data: 20-30%     Thank you for this consult. Palliative medicine will continue to follow and assist holistically.   Time Total: 75 minutes  Time spent includes: Detailed review of medical records (labs, imaging, vital signs), medically appropriate exam (mental status, respiratory, cardiac, skin), discussed with treatment team, counseling and educating patient, family and staff, documenting clinical information, medication management and coordination of care.     Kelly Pugh, Joyice Nodal- [facility] Palliative Medicine Team  07/21/2023 9:28 AM  Office [phone]  Pager [phone]     Please contact Palliative Medicine Team providers via AMION for questions and concerns.

## 2023-07-21 NOTE — Assessment & Plan Note (Addendum)
 Patient with very poor underlying functional status and cachectic. - Palliative care consult - Holding home Aricept  as she is n.p.o.

## 2023-07-21 NOTE — Progress Notes (Signed)
 PT Cancellation Note  Patient Details Name: Kelly Pugh MRN: [id number] DOB: [birth year]   Cancelled Treatment:    Reason Eval/Treat Not Completed: Other (comment). Orders received and chart reviewed. Due to pt's dementia and pt being non-verbal, called daughter, [name]. She reports pt is dependent for all her care at baseline and relies on totalA to transfer to/from [facility]. [name] states she does not think participating with PT is going to impact her mother's abilities. Pt does not seem appropriate for acute PT services. PT to sign off in house.    Marc Senior. Fairly IV, PT, DPT Physical Therapist- [place]  [facility]  07/21/2023, 12:57 PM

## 2023-07-21 NOTE — Progress Notes (Signed)
 Patient transferred to room 243 by bed with Larinda Plover, NT. Patient alert with no distress noted when leaving ICU. Report called prior to [facility], Charity fundraiser.

## 2023-07-22 DIAGNOSIS — Z515 Encounter for palliative care: Secondary | ICD-10-CM | POA: Diagnosis not present

## 2023-07-22 DIAGNOSIS — Z789 Other specified health status: Secondary | ICD-10-CM | POA: Diagnosis not present

## 2023-07-22 DIAGNOSIS — A419 Sepsis, unspecified organism: Secondary | ICD-10-CM | POA: Diagnosis not present

## 2023-07-22 DIAGNOSIS — J9601 Acute respiratory failure with hypoxia: Secondary | ICD-10-CM | POA: Diagnosis not present

## 2023-07-22 DIAGNOSIS — J441 Chronic obstructive pulmonary disease with (acute) exacerbation: Secondary | ICD-10-CM | POA: Diagnosis not present

## 2023-07-22 DIAGNOSIS — J189 Pneumonia, unspecified organism: Secondary | ICD-10-CM | POA: Diagnosis not present

## 2023-07-22 LAB — CBC
HCT: 22 % — ABNORMAL LOW (ref 36.0–46.0)
Hemoglobin: 7.3 g/dL — ABNORMAL LOW (ref 12.0–15.0)
MCH: 27.5 pg (ref 26.0–34.0)
MCHC: 33.2 g/dL (ref 30.0–36.0)
MCV: 83 fL (ref 80.0–100.0)
Platelets: 249 10*3/uL (ref 150–400)
RBC: 2.65 MIL/uL — ABNORMAL LOW (ref 3.87–5.11)
RDW: 14.5 % (ref 11.5–15.5)
WBC: 16.9 10*3/uL — ABNORMAL HIGH (ref 4.0–10.5)
nRBC: 0 % (ref 0.0–0.2)

## 2023-07-22 LAB — BASIC METABOLIC PANEL WITH GFR
Anion gap: 10 (ref 5–15)
BUN: 51 mg/dL — ABNORMAL HIGH (ref 8–23)
CO2: 23 mmol/L (ref 22–32)
Calcium: 9.1 mg/dL (ref 8.9–10.3)
Chloride: 109 mmol/L (ref 98–111)
Creatinine, Ser: 1.87 mg/dL — ABNORMAL HIGH (ref 0.44–1.00)
GFR, Estimated: 26 mL/min — ABNORMAL LOW (ref >60)
Glucose, Bld: 92 mg/dL (ref 70–99)
Potassium: 4.5 mmol/L (ref 3.5–5.1)
Sodium: 142 mmol/L (ref 135–145)

## 2023-07-22 LAB — ECHOCARDIOGRAM COMPLETE
AR max vel: 2.08 cm2
AV Peak grad: 6.6 mmHg
Ao pk vel: 1.28 m/s
Area-P 1/2: 5.78 cm2
Height: 63 in
P 1/2 time: 438 ms
S' Lateral: 2.3 cm
Weight: 1809.54 [oz_av]

## 2023-07-22 LAB — GLUCOSE, CAPILLARY: Glucose-Capillary: 85 mg/dL (ref 70–99)

## 2023-07-22 MED ORDER — ENSURE PLUS HIGH PROTEIN PO LIQD
237.0000 mL | Freq: Two times a day (BID) | ORAL | Status: DC
  Administered 2023-07-23: 237 mL via ORAL

## 2023-07-22 MED ORDER — CARVEDILOL 6.25 MG PO TABS
6.2500 mg | ORAL_TABLET | Freq: Two times a day (BID) | ORAL | Status: DC
  Administered 2023-07-22: 6.25 mg via ORAL
  Filled 2023-07-22: qty 1

## 2023-07-22 MED ORDER — LOSARTAN POTASSIUM 25 MG PO TABS
25.0000 mg | ORAL_TABLET | Freq: Every day | ORAL | Status: DC
  Administered 2023-07-22: 25 mg via ORAL
  Filled 2023-07-22: qty 1

## 2023-07-22 MED ORDER — LABETALOL HCL 5 MG/ML IV SOLN
20.0000 mg | INTRAVENOUS | Status: DC | PRN
  Administered 2023-07-22 – 2023-07-23 (×2): 20 mg via INTRAVENOUS
  Filled 2023-07-22 (×2): qty 4

## 2023-07-22 NOTE — Progress Notes (Signed)
 OT Cancellation Note  Patient Details Name: Kelly Pugh MRN: [id number] DOB: [birth year]   Cancelled Treatment:    Reason Eval/Treat Not Completed: OT screened, no needs identified, will sign off. Consult received, chart reviewed. Per daughter via PT, pt is dependent for all her care at baseline and relies on totalA to transfer to/from [facility]. No skilled OT needs identified. Will sign off.   [name] R., MPH, MS, OTR/L ascom [phone] 07/22/23, 8:56 AM

## 2023-07-22 NOTE — Assessment & Plan Note (Signed)
 Hypothermia Lactic acid 3.8, markedly elevated leukocytosis Organ involvement are cardiac and pulmonary Likely secondary to pneumonia, UA was not consistent with UTI Strict I's and O's Continue with antibiotics as mentioned above

## 2023-07-22 NOTE — Assessment & Plan Note (Signed)
 Pneumonia with collapse of left lower lung lobe Patient was able to wean back to room air now. Severe, with hypercapnia suspect secondary to community-acquired pneumonia, MRSA PCR negative, preliminary blood cultures negative-sputum culture ordered. Lactic acidosis resolved.  Leukocytosis improving Initially requiring BiPAP-now on room air -Continue with cefepime -Continue with azithromycin 500 mg daily,  - Vancomycin was discontinued -Continue with supportive care

## 2023-07-22 NOTE — Assessment & Plan Note (Signed)
 Mildly elevated blood pressure -Resume home carvedilol  and losartan as patient is now able to take p.o. -Continue with IV hydralazine -dose was increased

## 2023-07-22 NOTE — Progress Notes (Signed)
                                                     Palliative Care Progress Note, Assessment & Plan   Patient Name: Kelly Pugh       Date: 07/22/2023 DOB: [birth year]  Age: 83 y.o. MRN#: [id number] Attending Physician: Luna Salinas, MD Primary Care Physician: Devaughn Flowers, MD Admit Date: 07/20/2023  Subjective: Unable to assess.   HPI: 83 y.o. female  with past medical history of dememtia, COPD, HTN, CKD, non-insulin  dependent DM II and HLD. Pt presented to ED from home 07/20/2023 via EMS with complaint of respiratory distress. EMS reports patient was 69% RA on scene, improved to 94% on 10L NRB on arrival.  In ED, daughter reports that patient is bed bound and has been non-verbal for approximately 2 years from her dementia.  ED workup showed VBG 7.13/74/39, Na+ 139, K+ 5.8, chloride 108, bicarb 23, BUN 47, creatinine 1.92, GFR 25, WBC 16, Hgb 8.9, platelets 325, lactic 3.8, BNP 716.4 and troponin 1140. BP 177/82, HR 91, RR 23, temp 93.1, 95% on 2 L, later transitioned to BiPAP.  CXR shows near complete opacified left hemithorax.    Patient was admitted to step-down unit for management of acute hypoxic respiratory failure and severe sepsis with cardiac and pulmonary organ dysfunction.    PMT was consulted for goals of care conversation.   Summary of counseling/coordination of care: Extensive chart review completed prior to meeting patient including labs, vital signs, imaging, progress notes, orders, and available advanced directive documents from current and previous encounters.   After reviewing the patient's chart , I assessed patient at bedside.  Patient now in progressive unit. Elderly female resting in bed. Looks better than yesterday. BiPAP removed, no O2. She is alert with eyes open, but does not track or acknowledge my presence.  Even, unlabored respirations. She is in no distress.   Per SLP note, recommends pureed diet, thin liquid.   Physical Exam Constitutional:      General: She is not in acute distress.    Appearance: She is not ill-appearing.     Comments: Cachetic, frail  HENT:     Head: Normocephalic and atraumatic.     Mouth/Throat:     Mouth: Mucous membranes are dry.  Pulmonary:     Effort: Pulmonary effort is normal. No respiratory distress.  Musculoskeletal:     Right lower leg: No edema.     Left lower leg: No edema.  Skin:    General: Skin is warm and dry.  Neurological:     Mental Status: She is alert. She is disoriented.   Recommendations/Plan: Continue DNR/DNI status as previously documented    Continue current supportive interventions Palliative to follow for ongoing goals of care conversation Overall poor prognosis           Total Time 35 minutes   Discussed plan of care with Dr. Ariel Begun.  Time spent includes: Detailed review of medical records (labs, imaging, vital signs), medically appropriate exam (mental status, respiratory, cardiac, skin), discussed with treatment team, counseling and educating patient, family and staff, documenting clinical information, medication management and coordination of care.     Ina Manas, Joyice Nodal- [facility] Palliative Medicine Team  07/22/2023 10:03 AM  Office [phone]  Pager [phone]

## 2023-07-22 NOTE — Assessment & Plan Note (Signed)
 Concern of NSTEMI, troponin peaked at 1282 and now trending down. Unable to explain any chest pain Echocardiogram with normal EF, no regional wall motion abnormalities, grade 1 diastolic dysfunction and elevated pulmonary arterial pressure. -Supportive care

## 2023-07-22 NOTE — Progress Notes (Signed)
 Progress Note   Patient: Kelly Pugh:[id number] DOB: 1940-07-22 DOA: 07/20/2023     2 DOS: the patient was seen and examined on 07/22/2023   Brief hospital course: Kelly Pugh is an 83 year old female with history of dementia, COPD, hypertension, CKD, non-insulin -dependent diabetes mellitus type 2, hyperlipidemia, who presents emergency department for chief concerns of respiratory distress.  Vitals in the ED showed t 93.1, rr 23, heart rate 91, blood pressure 177/82, SpO2 95% on 2 L nasal cannula and then patient was placed on BiPAP therapy, VBG showed 7.13/74/39. Serum sodium is 139, potassium 5.8, chloride 108, bicarb 23, BUN 47, serum creatinine 1.92, EGFR 25, nonfasting blood glucose 88, WBC 16, hemoglobin 8.9, platelets of 325. BNP is 716.4.  High since troponin is 1140.  Lactic acid 3.8.  Blood cultures x 2 are in process.  Chest x-ray with large left sided effusion.  Pulmonary was consulted.  Patient was admitted with severe sepsis likely secondary to pneumonia.  Started on antibiotics.  6/1: Blood pressure elevated , still mildly tachypneic and remained on BiPAP.  POC ultrasound with fully collapsed left lower lobe with hepatization consistent with consolidation and pneumonia there was no pleural effusion or loculation.  There was also concern of aspiration or mucous plugging resulted in complete left lower lobe collapse.  Patient is DNR and does not want to be intubated for the purpose because of flexible bronchoscopy.  MRSA PCR negative so vancomycin was discontinued.  Preliminary blood cultures negative in 24 hours. CBC with worsening leukocytosis at 26.2-ceftriaxone is being switched with cefepime.  BMP with mild hyperkalemia with potassium at 5.5, CO2 of 21, some improvement in renal function to 1.84.  Troponin at 1253. Echocardiogram pending.  Patient appears very lethargic and poor underlying functional status-very high risk for mortality.  Palliative care was also  consulted to discuss goals of care.  6/2: Mildly elevated blood pressure but overall improved vital, now saturating well on room air.  Improving leukocytosis, stable renal function. Echocardiogram with normal EF, grade 1 diastolic dysfunction and elevated pulmonary arterial pressure.  Assessment and Plan: * Acute respiratory failure with hypoxia and hypercapnia (HCC) Pneumonia with collapse of left lower lung lobe Patient was able to wean back to room air now. Severe, with hypercapnia suspect secondary to community-acquired pneumonia, MRSA PCR negative, preliminary blood cultures negative-sputum culture ordered. Lactic acidosis resolved.  Leukocytosis improving Initially requiring BiPAP-now on room air -Continue with cefepime -Continue with azithromycin 500 mg daily,  - Vancomycin was discontinued -Continue with supportive care  Severe sepsis with acute organ dysfunction (HCC) Hypothermia Lactic acid 3.8, markedly elevated leukocytosis Organ involvement are cardiac and pulmonary Likely secondary to pneumonia, UA was not consistent with UTI Strict I's and O's Continue with antibiotics as mentioned above  Elevated troponin Concern of NSTEMI, troponin peaked at 1282 and now trending down. Unable to explain any chest pain Echocardiogram with normal EF, no regional wall motion abnormalities, grade 1 diastolic dysfunction and elevated pulmonary arterial pressure. -Supportive care  COPD  No wheezing. Continuous pulse oximetry DuoNebs every 6 hours as needed for shortness of breath and wheezing,  HTN (hypertension) Mildly elevated blood pressure -Resume home carvedilol  and losartan as patient is now able to take p.o. -Continue with IV hydralazine -dose was increased  Chronic kidney disease, stage 4 (severe) (HCC) At base line - Monitor renal function -Avoid nephrotoxins  Hyperlipidemia, mixed Home atorvastatin 10 mg daily not resumed on admission as patient is  n.p.o.  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 - Palliative care consult - Holding home Aricept  as she is n.p.o.   Subjective: Patient appears comfortable, remained nonverbal and not following most of the commands but this is her baseline  Physical Exam: Vitals:   07/22/23 0200 07/22/23 0330 07/22/23 0704 07/22/23 1215  BP:  (!) 186/79 (!) 145/84 (!) 154/88  Pulse:  (!) 105 (!) 105 (!) 102  Resp:  20 20 20   Temp:  98.4 F (36.9 C) 99.1 F (37.3 C) 98.8 F (37.1 C)  TempSrc:  Oral Oral   SpO2: 97% 97% 97% 98%  Weight:      Height:       General.  Frail and cachectic elderly lady, in no acute distress. Pulmonary.  Lungs clear bilaterally, normal respiratory effort. CV.  Regular rate and rhythm, no JVD, rub or murmur. Abdomen.  Soft, nontender, nondistended, BS positive. CNS.  Alert , no apparent new focal deficit Extremities.  No edema, no cyanosis, pulses intact and symmetrical.  Data Reviewed: Prior data reviewed  Family Communication: Talked with daughter on phone.  Disposition: Status is: Inpatient Remains inpatient appropriate because: Severity of illness  Planned Discharge Destination:   DVT prophylaxis.  Subcu heparin  Time spent: 50 minutes  This record has been created using Conservation officer, historic buildings. Errors have been sought and corrected,but may not always be located. Such creation errors do not reflect on the standard of care.   Author: Luna Salinas, MD 07/22/2023 3:13 PM  For on call review [email or web address].

## 2023-07-22 NOTE — Evaluation (Signed)
 Clinical/Bedside Swallow Evaluation Patient Details  Name: Kelly Pugh MRN: [id number] Date of Birth: Aug 08, 1940  Today's Date: 07/22/2023 Time: SLP Start Time (ACUTE ONLY): 0940 SLP Stop Time (ACUTE ONLY): 0958 SLP Time Calculation (min) (ACUTE ONLY): 18 min  Past Medical History:  Past Medical History:  Diagnosis Date   Cervical cancer (HCC)    Past Surgical History:  Past Surgical History:  Procedure Laterality Date   BREAST LUMPECTOMY     HPI:  [name] is an 83 year old female with history of dementia, COPD, hypertension, CKD, non-insulin -dependent diabetes mellitus type 2, hyperlipidemia, who presents emergency department for chief concerns of respiratory distress. Chest x-ray with large left sided effusion. Per chart pt last admitted 10/2021 for suspected aspiration pneumonia.    Assessment / Plan / Recommendation  Clinical Impression  Pt's daughter is at bedside. She reports that pt is currently at cognitive communication baseline which includes pt being nonverbal, unable to follow directions and bed bound. Daughter further reports pt consumes largely pureed diet with thin liquids via straw, medicine crushed in puree. At baseline, pt intermittently holds liquids with her daughter reporting need to re-introduce straw into pt's mouth to help elicit swallow response.       During this evaluation, pt opened eyes and maintained alertness to verbal stimuli, was side laying to pt's left with HOB elevated. She opened her mouth in response to spoon at her lips. When consuming ice chips, thin liquids via straw and pudding, pt with the appearance of functional oral abilities with no oral residue following swallow. While pt didn't exhibit any overt s/s of aspiration, she did intermittently hold the thin liquids (2 out of 6 trials). Extensive education provided to pt's daughter on likely cognitive component of oral holding and increased risk of potential aspiration with oral  holding. Pt's daughter voiced understanding. At this time, pt's oropharyngeal abilities appear at baseline and is appropriate to return to dysphagia 1 diet with thin liquids via straw and medicine crushed in puree.     Extensive education provided on increased aspiration risk and don't suspect that altering diet consistency will change oral holding or reduce aspiration risk but might result in increased dehydration. SLP Visit Diagnosis: Dysphagia, oral phase (R13.11)    Aspiration Risk  Mild aspiration risk    Diet Recommendation Dysphagia 1 (Puree);Thin liquid    Liquid Administration via: Straw Medication Administration: Crushed with puree Supervision: Full supervision/cueing for compensatory strategies Compensations: Minimize environmental distractions;Slow rate;Small sips/bites Postural Changes: Seated upright at 90 degrees;Remain upright for at least 30 minutes after po intake    Other  Recommendations Oral Care Recommendations: Oral care BID;Oral care before and after PO    Recommendations for follow up therapy are one component of a multi-disciplinary discharge planning process, led by the attending physician.  Recommendations may be updated based on patient status, additional functional criteria and insurance authorization.  Follow up Recommendations No SLP follow up      Assistance Recommended at Discharge  N/A  Functional Status Assessment Patient has not had a recent decline in their functional status  Frequency and Duration   N/A         Prognosis Prognosis for improved oropharyngeal function: Fair Barriers to Reach Goals: Cognitive deficits;Time post onset;Severity of deficits      Swallow Study   General Date of Onset: 07/20/23 HPI: [name] is an 83 year old female with history of dementia, COPD, hypertension, CKD, non-insulin -dependent diabetes mellitus type 2, hyperlipidemia, who presents emergency department  for chief concerns of respiratory distress.  Chest x-ray with large left sided effusion. Per chart pt last admitted 10/2021 for suspected aspiration pneumonia. Type of Study: Bedside Swallow Evaluation Previous Swallow Assessment: none in chart Diet Prior to this Study: NPO Temperature Spikes Noted: No Respiratory Status: Room air History of Recent Intubation: No Behavior/Cognition: Alert;Confused;Doesn't follow directions;Requires cueing Oral Cavity Assessment: Within Functional Limits Oral Care Completed by SLP: Recent completion by staff Oral Cavity - Dentition: Missing dentition;Poor condition Self-Feeding Abilities: Total assist Patient Positioning: Upright in bed (sidelaying) Baseline Vocal Quality: Not observed Volitional Cough: Cognitively unable to elicit Volitional Swallow: Unable to elicit    Oral/Motor/Sensory Function Overall Oral Motor/Sensory Function:  (unable to assess d/t baseline dementia)   Ice Chips Ice chips: Within functional limits Presentation: Spoon   Thin Liquid Thin Liquid: Impaired Presentation: Straw Oral Phase Functional Implications: Oral holding (in 2 out of 6 trials)    Nectar Thick Nectar Thick Liquid: Not tested   Honey Thick Honey Thick Liquid: Not tested   Puree Puree: Impaired Presentation: Spoon Oral Phase Functional Implications: Oral holding (intermittent)   Solid     Solid: Not tested     Kelly Pugh B. Garlin Junker, M.S., CCC-SLP, Tree surgeon Certified Brain Injury Specialist [facility]  [facility] Office [phone] Ascom [phone] Fax [phone]

## 2023-07-22 NOTE — Assessment & Plan Note (Signed)
 -   At baseline -Monitor renal function -Avoid nephrotoxins

## 2023-07-22 NOTE — Care Management Important Message (Signed)
 Important Message  Patient Details  Name: Kelly Pugh MRN: [id number] Date of Birth: 11/08/1940   Important Message Given:  Yes - Medicare IM     Anise Kerns 07/22/2023, 1:00 PM

## 2023-07-22 NOTE — Progress Notes (Signed)
   07/22/23 1726  Vitals  Temp 99.5 F (37.5 C)  Temp Source Oral  BP (!) 208/90  MAP (mmHg) 125  BP Location Left Arm  BP Method Automatic  Patient Position (if appropriate) Lying  Pulse Rate (!) 109  Pulse Rate Source Dinamap  MEWS COLOR  MEWS Score Color Yellow  Oxygen Therapy  SpO2 97 %  O2 Device Room Air  MEWS Score  MEWS Temp 0  MEWS Systolic 2  MEWS Pulse 1  MEWS RR 0  MEWS LOC 0  MEWS Score 3   PRN IV hydralazine , PO coreg  and losartan given per orders. Dr. Ariel Begun aware.   1818: BP 187/77  1855: BP 153/23. Dr. Ariel Begun aware and states no need for further intervention at this time.

## 2023-07-22 NOTE — Plan of Care (Signed)

## 2023-07-23 DIAGNOSIS — A419 Sepsis, unspecified organism: Secondary | ICD-10-CM | POA: Diagnosis not present

## 2023-07-23 DIAGNOSIS — J441 Chronic obstructive pulmonary disease with (acute) exacerbation: Secondary | ICD-10-CM | POA: Diagnosis not present

## 2023-07-23 DIAGNOSIS — Z515 Encounter for palliative care: Secondary | ICD-10-CM | POA: Diagnosis not present

## 2023-07-23 DIAGNOSIS — J9602 Acute respiratory failure with hypercapnia: Secondary | ICD-10-CM | POA: Diagnosis not present

## 2023-07-23 DIAGNOSIS — J9601 Acute respiratory failure with hypoxia: Secondary | ICD-10-CM | POA: Diagnosis not present

## 2023-07-23 DIAGNOSIS — J189 Pneumonia, unspecified organism: Secondary | ICD-10-CM | POA: Diagnosis not present

## 2023-07-23 LAB — LEGIONELLA PNEUMOPHILA SEROGP 1 UR AG: L. pneumophila Serogp 1 Ur Ag: NEGATIVE

## 2023-07-23 MED ORDER — AMOXICILLIN-POT CLAVULANATE 400-57 MG/5ML PO SUSR: 400.0000 mg | Freq: Two times a day (BID) | ORAL | 0 refills | Status: AC

## 2023-07-23 MED ORDER — AZITHROMYCIN 200 MG/5ML PO SUSR
500.0000 mg | Freq: Every day | ORAL | Status: DC
  Filled 2023-07-23: qty 12.5

## 2023-07-23 MED ORDER — CARVEDILOL 12.5 MG PO TABS
12.5000 mg | ORAL_TABLET | Freq: Two times a day (BID) | ORAL | Status: DC
  Filled 2023-07-23: qty 1

## 2023-07-23 MED ORDER — AMOXICILLIN-POT CLAVULANATE 400-57 MG/5ML PO SUSR: 400.0000 mg | Freq: Two times a day (BID) | ORAL | Status: DC

## 2023-07-23 MED ORDER — AMLODIPINE BESYLATE 10 MG PO TABS
10.0000 mg | ORAL_TABLET | Freq: Every day | ORAL | 1 refills | Status: AC
Start: 2023-07-23 — End: ?

## 2023-07-23 MED ORDER — NEPRO PO LIQD: ORAL | 2 refills | Status: DC

## 2023-07-23 MED ORDER — AZITHROMYCIN 200 MG/5ML PO SUSR: 500.0000 mg | Freq: Every day | ORAL | 0 refills | Status: DC

## 2023-07-23 MED ORDER — LOSARTAN POTASSIUM 50 MG PO TABS
50.0000 mg | ORAL_TABLET | Freq: Every day | ORAL | Status: DC
  Administered 2023-07-23: 50 mg via ORAL
  Filled 2023-07-23: qty 1

## 2023-07-23 NOTE — Progress Notes (Signed)
 Daily Progress Note   Patient Name: Kelly Pugh       Date: 07/23/2023 DOB: Sep 28, 1940  Age: 83 y.o. MRN#: [id number] Attending Physician: Luna Salinas, MD Primary Care Physician: Devaughn Flowers, MD Admit Date: 07/20/2023  Reason for Consultation/Follow-up: Establishing goals of care  Subjective: Notes and labs reviewed.  In to see patient.  She is currently resting in bed at this time, no family at bedside.  She does not awaken to voice or touch.  She does not appear to be in any distress at this time.  Per notes patient is discharging with hospice to follow.  PMT will sign off at this time.  Length of Stay: 3  Current Medications: Scheduled Meds:   amoxicillin -clavulanate  400 mg Oral Q12H   azithromycin  500 mg Oral Daily   carvedilol   12.5 mg Oral BID WC   Chlorhexidine Gluconate Cloth  6 each Topical Daily   feeding supplement  237 mL Oral BID BM   heparin   5,000 Units Subcutaneous Q8H   losartan  50 mg Oral Daily   mouth rinse  15 mL Mouth Rinse 4 times per day    Continuous Infusions:   PRN Meds: acetaminophen  **OR** acetaminophen , hydrALAZINE , ipratropium-albuterol , labetalol, metoprolol tartrate, ondansetron  **OR** ondansetron  (ZOFRAN ) IV, senna-docusate  Physical Exam Constitutional:      Comments: Eyes closed.  Thin and frail.   Pulmonary:     Effort: Pulmonary effort is normal.  Skin:    General: Skin is warm and dry.             Vital Signs: BP (!) 174/72   Pulse 84   Temp 97.9 F (36.6 C) (Axillary)   Resp 18   Ht 5\' 3"  (1.6 m)   Wt 51.3 kg   SpO2 97%   BMI 20.03 kg/m  SpO2: SpO2: 97 % O2 Device: O2 Device: Room Air O2 Flow Rate: O2 Flow Rate (L/min): 2 L/min  Intake/output summary:  Intake/Output Summary (Last 24 hours) at  07/23/2023 1216 Last data filed at 07/23/2023 1100 Gross per 24 hour  Intake 340 ml  Output 200 ml  Net 140 ml   LBM: Last BM Date : 07/22/23 Baseline Weight: Weight: 51.3 kg Most recent weight: Weight: 51.3 kg         Patient Active Problem  List   Diagnosis Date Noted   CAP (community acquired pneumonia) 07/21/2023   Elevated troponin 07/21/2023   Acute respiratory failure with hypoxia and hypercapnia (HCC) 07/20/2023   Severe sepsis with acute organ dysfunction (HCC) 07/20/2023   Aspiration pneumonia (HCC) 10/20/2021   Acute hypoxic respiratory failure (HCC) 10/20/2021   COPD  10/20/2021   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   Dementia, old age, without behavioral disturbance (HCC) 12/09/2019   Abnormal SPEP 08/19/2019   Normocytic anemia 08/19/2019   Type II diabetes mellitus (HCC) 08/19/2019   Chronic kidney disease, stage 4 (severe) (HCC) 03/06/2019   Abnormal ECG 04/04/2018   Central retinal vein occlusion, left eye, with macular edema 01/27/2018   Encounter for monitoring aromatase inhibitor therapy 10/09/2017   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 PMT will sign off at this time. Code Status:    Code Status Orders  (From admission, onward)           Start     Ordered   07/20/23 1350  Do not attempt resuscitation (DNR)- Limited -Do Not Intubate (DNI)  Continuous       Question Answer Comment  If pulseless  and not breathing No CPR or chest compressions.   In Pre-Arrest Conditions (Patient Is Breathing and Has A Pulse) Do not intubate. Provide all appropriate non-invasive medical interventions. Avoid ICU transfer unless indicated or required.   Consent: Discussion documented in EHR or advanced directives reviewed      07/20/23 1351           Code Status History     Date Active Date Inactive Code Status Order ID Comments User Context   10/20/2021 0417 10/21/2021 1700 Full Code [id number]  Lanetta Pion, MD ED      Advance Directive Documentation    Flowsheet Row Most Recent Value  Type of Advance Directive Out of facility DNR (pink MOST or yellow form)  Pre-existing out of facility DNR order (yellow form or pink MOST form) --  "MOST" Form in Place? --       Thank you for allowing the Palliative Medicine Team to assist in the care of this patient.   Meribeth Standard, NP  Please contact Palliative Medicine Team phone at [phone] for questions and concerns.

## 2023-07-23 NOTE — TOC Transition Note (Signed)
 Transition of Care [facility]) - Discharge Note   Patient Details  Name: Kelly Pugh MRN: [id number] Date of Birth: 29-Sep-1940  [facility] CM/SW Contact:  Odilia Bennett, LCSW Phone Number: 07/23/2023, 11:22 AM   Clinical Narrative:  Patient has orders to discharge home today. [facility] liaison, Alisa App, is aware. Daughter confirmed that patient will need ambulance transport home. Address on facesheet is correct. [facility] set up for 12:30 per daughter request. They said they'll likely arrive around 1:00. No further concerns. CSW signing off.   Final next level of care: Home w Hospice Care Barriers to Discharge: Barriers Resolved   Patient Goals and CMS Choice            Discharge Placement                Patient to be transferred to facility by: [facility] Name of family member notified: Jaymes Mew Patient and family notified of of transfer: 07/23/23  Discharge Plan and Services Additional resources added to the After Visit Summary for                                       Social Drivers of Health (SDOH) Interventions SDOH Screenings   Food Insecurity: Patient Unable To Answer (07/20/2023)  Housing: Patient Unable To Answer (07/20/2023)  Transportation Needs: Patient Unable To Answer (07/20/2023)  Utilities: Patient Unable To Answer (07/20/2023)  Financial Resource Strain: Medium Risk (07/14/2021)   Received from [facility]  Physical Activity: Inactive (07/14/2021)   Received from [facility]  Social Connections: Patient Unable To Answer (07/20/2023)  Stress: No Stress Concern Present (01/03/2022)   Received [facility]  Tobacco Use: Medium Risk (07/20/2023)     Readmission Risk Interventions    07/21/2023    1:54 PM  Readmission Risk Prevention Plan  HRI or Home Care Consult Complete  Palliative Care Screening Complete

## 2023-07-23 NOTE — Plan of Care (Signed)
  Problem: Clinical Measurements: Goal: Cardiovascular complication will be avoided Outcome: Progressing   Problem: Coping: Goal: Level of anxiety will decrease Outcome: Progressing

## 2023-07-23 NOTE — Plan of Care (Signed)

## 2023-07-23 NOTE — Plan of Care (Signed)
  Problem: Fluid Volume: Goal: Hemodynamic stability will improve Outcome: Adequate for Discharge   Problem: Clinical Measurements: Goal: Diagnostic test results will improve Outcome: Adequate for Discharge Goal: Signs and symptoms of infection will decrease Outcome: Adequate for Discharge   Problem: Respiratory: Goal: Ability to maintain adequate ventilation will improve Outcome: Adequate for Discharge   Problem: Education: Goal: Knowledge of General Education information will improve Description: Including pain rating scale, medication(s)/side effects and non-pharmacologic comfort measures Outcome: Adequate for Discharge   Problem: Health Behavior/Discharge Planning: Goal: Ability to manage health-related needs will improve Outcome: Adequate for Discharge   Problem: Clinical Measurements: Goal: Ability to maintain clinical measurements within normal limits will improve Outcome: Adequate for Discharge Goal: Will remain free from infection Outcome: Adequate for Discharge Goal: Diagnostic test results will improve Outcome: Adequate for Discharge Goal: Respiratory complications will improve Outcome: Adequate for Discharge Goal: Cardiovascular complication will be avoided 07/23/2023 1111 by Franki Isles, RN Outcome: Adequate for Discharge 07/23/2023 1102 by Franki Isles, RN Outcome: Progressing   Problem: Activity: Goal: Risk for activity intolerance will decrease Outcome: Adequate for Discharge   Problem: Nutrition: Goal: Adequate nutrition will be maintained Outcome: Adequate for Discharge   Problem: Coping: Goal: Level of anxiety will decrease 07/23/2023 1111 by Franki Isles, RN Outcome: Adequate for Discharge 07/23/2023 1102 by Franki Isles, RN Outcome: Progressing   Problem: Elimination: Goal: Will not experience complications related to bowel motility Outcome: Adequate for Discharge Goal: Will not experience complications  related to urinary retention Outcome: Adequate for Discharge   Problem: Pain Managment: Goal: General experience of comfort will improve and/or be controlled Outcome: Adequate for Discharge   Problem: Safety: Goal: Ability to remain free from injury will improve Outcome: Adequate for Discharge   Problem: Skin Integrity: Goal: Risk for impaired skin integrity will decrease Outcome: Adequate for Discharge

## 2023-07-23 NOTE — Discharge Summary (Addendum)
 Physician Discharge Summary   Patient: Kelly Pugh MRN: [id number] DOB: November 04, 1940  Admit date:     07/20/2023  Discharge date: 07/23/23  Discharge Physician: Luna Salinas   PCP: Devaughn Flowers, MD   Recommendations at discharge:  Please obtain CBC and BMP on follow-up Please discontinue losartan if still a concern of hyperkalemia. 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 is an 83 year old female with history of dementia, COPD, hypertension, CKD, non-insulin -dependent diabetes mellitus type 2, hyperlipidemia, who presents emergency department for chief concerns of respiratory distress.  Vitals in the ED showed t 93.1, rr 23, heart rate 91, blood pressure 177/82, SpO2 95% on 2 L nasal cannula and then patient was placed on BiPAP therapy, VBG showed 7.13/74/39. Serum sodium is 139, potassium 5.8, chloride 108, bicarb 23, BUN 47, serum creatinine 1.92, EGFR 25, nonfasting blood glucose 88, WBC 16, hemoglobin 8.9, platelets of 325. BNP is 716.4.  High since troponin is 1140.  Lactic acid 3.8.  Blood cultures x 2 are in process.  Chest x-ray with large left sided effusion.  Pulmonary was consulted.  Patient was admitted with severe sepsis likely secondary to pneumonia.  Started on antibiotics.  6/1: Blood pressure elevated , still mildly tachypneic and  remained on BiPAP.  POC ultrasound with fully collapsed left lower lobe with hepatization consistent with consolidation and pneumonia there was no pleural effusion or loculation.  There was also concern of aspiration or mucous plugging resulted in complete left lower lobe collapse.  Patient is DNR and does not want to be intubated for the purpose because of flexible bronchoscopy.  MRSA PCR negative so vancomycin was discontinued.  Preliminary blood cultures negative in 24 hours. CBC with worsening leukocytosis at 26.2-ceftriaxone is being switched with cefepime.  BMP with mild hyperkalemia with potassium at 5.5, CO2 of 21, some improvement in renal function to 1.84.  Troponin at 1253. Echocardiogram pending.  Patient appears very lethargic and poor underlying functional status-very high risk for mortality.  Palliative care was also consulted to discuss goals of care.  6/2: Mildly elevated blood pressure but overall improved vital, now saturating well on room air.  Improving leukocytosis, stable renal function. Echocardiogram with normal EF, grade 1 diastolic dysfunction and elevated pulmonary arterial pressure.  6/3: Hemodynamically stable but blood pressure remained elevated, patient otherwise at her baseline.  Eating and drinking with help of her daughter.  She was instructed to restart taking her home amlodipine  as it was recently discontinued.  She will continue with current dose of losartan-losartan dose was not increased due to concern of hyperkalemia on admission.  Patient did receive stress doses of steroid which were not continued.  Patient remained on room air without any difficulty.  Upper respiratory symptoms has been improved.  Patient likely have demand ischemia with elevated troponin and unable to explain any symptoms due to advanced dementia.    Patient  will resume her hospice help at home on discharge.  She is being discharged on 3 more days of Augmentin  and 2 days of  Zithromax.  She will continue the rest of her home medications and follow-up with her providers for further assistance.  Assessment and Plan: * Acute respiratory failure with hypoxia and hypercapnia (HCC) Pneumonia with collapse of left lower lung lobe Patient was able to wean back to room air now. Severe, with hypercapnia suspect secondary to community-acquired pneumonia, MRSA PCR negative, preliminary blood cultures negative-sputum culture ordered. Lactic acidosis resolved.  Leukocytosis improving Initially requiring BiPAP-now on room air -Received cefepime while in the hospital and is being discharged on Augmentin  -Continue with azithromycin 500 mg daily,  - Vancomycin was discontinued -Continue with supportive care  Severe sepsis with acute organ dysfunction (HCC) Hypothermia Lactic acid 3.8, markedly elevated leukocytosis Organ involvement are cardiac and pulmonary Likely secondary to pneumonia, UA was not consistent with UTI.  Sepsis resolved and lactic acidosis normalized Strict I's and O's Continue with antibiotics as mentioned above  Elevated troponin Concern of NSTEMI, troponin peaked at 1282 and now trending down.  Most likely due to type II MI with demand ischemia secondary to pneumonia. Unable to explain any chest pain Echocardiogram with normal EF, no regional wall motion abnormalities, grade 1 diastolic dysfunction and elevated pulmonary arterial pressure. -Supportive care  COPD  No wheezing. Continuous pulse oximetry DuoNebs every 6 hours as needed for shortness of breath and wheezing,  HTN (hypertension) Blood pressures elevated. Patient was instructed to restart home amlodipine  and continue with losartan and Coreg .  Chronic kidney disease, stage 4 (severe) (HCC) At base line - Monitor renal function -Avoid nephrotoxins  Hyperlipidemia, mixed Home atorvastatin 10 mg daily not resumed on admission as patient is n.p.o.  Dementia, old age, without  behavioral disturbance (HCC) Patient with very poor underlying functional status and cachectic. - Palliative care consult - Continue home Aricept  on discharge  Consultants: PCCM Procedures performed: None Disposition: Hospice care Diet recommendation:  Discharge Diet Orders (From admission, onward)     Start     Ordered   07/23/23 0000  Diet - low sodium heart healthy        07/23/23 1049           Dysphagia type 1 thin Liquid DISCHARGE MEDICATION: Allergies as of 07/23/2023       Reactions   Hydrocodone-acetaminophen  Nausea Only   Oxycodone-acetaminophen  Nausea Only        Medication List     TAKE these medications    acetaminophen  650 MG CR tablet Commonly known as: TYLENOL  Take 650 mg by mouth every 8 (eight) hours as needed for pain.   albuterol  108 (90 Base) MCG/ACT inhaler Commonly known as: VENTOLIN  HFA Inhale 2 puffs into the lungs every 6 (six) hours as needed for wheezing.   amLODipine  10 MG tablet Commonly known as: NORVASC  Take 1 tablet (10 mg total) by mouth daily.   amoxicillin -clavulanate 400-57 MG/5ML suspension Commonly known as: AUGMENTIN  Take 5 mLs (400 mg total) by mouth every 12 (twelve) hours for 3 days.   atorvastatin 10 MG tablet Commonly known as: LIPITOR Take 1 tablet by mouth daily.   azithromycin 200 MG/5ML suspension Commonly known as: ZITHROMAX Take 12.5 mLs (500 mg total) by mouth daily.   carvedilol  6.25 MG tablet Commonly known as: COREG  Take 6.25 mg by mouth 2 (two) times daily.   cyanocobalamin 1000 MCG tablet Commonly known as: VITAMIN B12 Take 1,000 mcg by  mouth daily.   donepezil  5 MG tablet Commonly known as: ARICEPT  Take 5 mg by mouth at bedtime.   latanoprost  0.005 % ophthalmic solution Commonly known as: XALATAN  Place 1 drop into both eyes at bedtime.   losartan 25 MG tablet Commonly known as: COZAAR Take 1 tablet by mouth daily.   Nepro Liqd Take between meals   Vitamin D-1000 Max St 25 MCG  (1000 UT) tablet Generic drug: Cholecalciferol Take 1,000 Units by mouth daily.        Follow-up Information     Care, [facility] Follow up.   Why: 06/01---Per [facility] will continue to follow through hospital course. Contact information: [address] [place] [address] [phone]         Devaughn Flowers, MD. Schedule an appointment as soon as possible for a visit in 1 week(s).   Specialty: Internal Medicine Contact information: [address] [place] [address] [phone]                Discharge Exam: Cleavon Curls Weights   07/20/23 1111  Weight: 51.3 kg   General.  Frail and cachectic elderly lady, in no acute distress. Pulmonary.  Lungs clear bilaterally, normal respiratory effort. CV.  Regular rate and rhythm, no JVD, rub or murmur. Abdomen.  Soft, nontender, nondistended, BS positive. CNS.  Alert, nonverbal, no apparent neurodeficit.  Patient does not follow commands which is her baseline.  Multiple contractures Extremities.  No edema, pulses intact and symmetrical.  Condition at discharge: stable  The results of significant diagnostics from this hospitalization (including imaging, microbiology, ancillary and laboratory) are listed below for reference.   Imaging Studies: ECHOCARDIOGRAM COMPLETE Result Date: 07/22/2023    ECHOCARDIOGRAM REPORT   Patient Name:   SHELDON AMARA Longstreth Date of Exam: 07/21/2023 Medical Rec #:  [id number]         Height:       63.0 in Accession #:    [id number]        Weight:       113.1 lb Date of Birth:  Jun 08, 1940        BSA:          1.518 m Patient Age:    82 years          BP:           123/90 mmHg Patient Gender: F                 HR:           90 bpm. Exam Location:  [facility] Procedure: 2D Echo (Both Spectral and Color Flow Doppler were utilized during            procedure). Indications:     ABNORMAL ECG R94.31  History:         Patient has no prior history of Echocardiogram  examinations.  Sonographer:     Jane Meager RDCS Referring Phys:  [id number] Cletis Muma Diagnosing Phys: Antionette Kirks MD  Sonographer Comments: Image acquisition challenging due to respiratory motion. IMPRESSIONS  1. Left ventricular ejection fraction, by estimation, is 55 to 60%. The left ventricle has normal function. The left ventricle has no regional wall motion abnormalities. There is mild left ventricular hypertrophy. Left ventricular diastolic parameters are consistent with Grade I diastolic dysfunction (impaired relaxation).  2. Right ventricular systolic function is normal. The right ventricular size is normal. There is moderately elevated pulmonary artery systolic pressure. The estimated right ventricular systolic  pressure is 55.7 mmHg.  3. The mitral valve is normal in structure. Mild mitral valve regurgitation. No evidence of mitral stenosis.  4. Tricuspid valve regurgitation is moderate to severe.  5. The aortic valve is normal in structure. Aortic valve regurgitation is mild. Aortic valve sclerosis/calcification is present, without any evidence of aortic stenosis.  6. The inferior vena cava is normal in size with greater than 50% respiratory variability, suggesting right atrial pressure of 3 mmHg. FINDINGS  Left Ventricle: Left ventricular ejection fraction, by estimation, is 55 to 60%. The left ventricle has normal function. The left ventricle has no regional wall motion abnormalities. The left ventricular internal cavity size was normal in size. There is  mild left ventricular hypertrophy. Left ventricular diastolic parameters are consistent with Grade I diastolic dysfunction (impaired relaxation). Right Ventricle: The right ventricular size is normal. No increase in right ventricular wall thickness. Right ventricular systolic function is normal. There is moderately elevated pulmonary artery systolic pressure. The tricuspid regurgitant velocity is 3.63 m/s, and with an assumed right atrial pressure  of 3 mmHg, the estimated right ventricular systolic pressure is 55.7 mmHg. Left Atrium: Left atrial size was normal in size. Right Atrium: Right atrial size was normal in size. Pericardium: Trivial pericardial effusion is present. Mitral Valve: The mitral valve is normal in structure. Mild mitral valve regurgitation. No evidence of mitral valve stenosis. Tricuspid Valve: The tricuspid valve is normal in structure. Tricuspid valve regurgitation is moderate to severe. No evidence of tricuspid stenosis. Aortic Valve: The aortic valve is normal in structure. Aortic valve regurgitation is mild. Aortic regurgitation PHT measures 438 msec. Aortic valve sclerosis/calcification is present, without any evidence of aortic stenosis. Aortic valve peak gradient measures 6.6 mmHg. Pulmonic Valve: The pulmonic valve was normal in structure. Pulmonic valve regurgitation is not visualized. No evidence of pulmonic stenosis. Aorta: The aortic root is normal in size and structure. Venous: The inferior vena cava is normal in size with greater than 50% respiratory variability, suggesting right atrial pressure of 3 mmHg. IAS/Shunts: No atrial level shunt detected by color flow Doppler. Additional Comments: There is a small pleural effusion in the left lateral region.  LEFT VENTRICLE PLAX 2D LVIDd:         3.30 cm   Diastology LVIDs:         2.30 cm   LV e' medial:    6.68 cm/s LV PW:         1.10 cm   LV E/e' medial:  13.2 LV IVS:        1.10 cm   LV e' lateral:   6.68 cm/s LVOT diam:     1.90 cm   LV E/e' lateral: 13.2 LV SV:         57 LV SV Index:   38 LVOT Area:     2.84 cm  RIGHT VENTRICLE RV Basal diam:  3.00 cm RV S prime:     10.40 cm/s TAPSE (M-mode): 2.0 cm LEFT ATRIUM           Index        RIGHT ATRIUM           Index LA diam:      2.40 cm 1.58 cm/m   RA Area:     10.70 cm LA Vol (A2C): 21.2 ml 13.97 ml/m  RA Volume:   20.90 ml  13.77 ml/m LA Vol (A4C): 24.6 ml 16.21 ml/m  AORTIC VALVE  PULMONIC VALVE AV  Area (Vmax): 2.08 cm     PV Vmax:        0.62 m/s AV Vmax:        128.00 cm/s  PV Peak grad:   1.5 mmHg AV Peak Grad:   6.6 mmHg     RVOT Peak grad: 1 mmHg LVOT Vmax:      93.80 cm/s LVOT Vmean:     62.800 cm/s LVOT VTI:       0.201 m AI PHT:         438 msec  AORTA Ao Root diam: 3.00 cm MITRAL VALVE                TRICUSPID VALVE MV Area (PHT): 5.78 cm     TR Peak grad:   52.7 mmHg MV Decel Time: 131 msec     TR Vmax:        363.00 cm/s MV E velocity: 87.87 cm/s MV A velocity: 134.33 cm/s  SHUNTS MV E/A ratio:  0.65         Systemic VTI:  0.20 m                             Systemic Diam: 1.90 cm Antionette Kirks MD Electronically signed by Antionette Kirks MD Signature Date/Time: 07/22/2023/12:49:51 PM    Final    DG Chest Port 1 View Result Date: 07/20/2023 CLINICAL DATA:  Hypoxia EXAM: PORTABLE CHEST 1 VIEW COMPARISON:  10/19/2021. FINDINGS: Nearly completely opacified left hemithorax with suggestion of a large left-sided pleural effusion. Cannot exclude underlying lesions, and follow up is recommended. Postop changes right mastectomy with axillary dissection. Osseous structures appear grossly intact. IMPRESSION: Nearly completely opacified left hemithorax. Suspect large left-sided effusion. Follow-up recommended. Electronically Signed   By: Sydell Eva M.D.   On: 07/20/2023 12:01    Microbiology: Results for orders placed or performed during the hospital encounter of 07/20/23  Resp panel by RT-PCR (RSV, Flu A&B, Covid) Anterior Nasal Swab     Status: None   Collection Time: 07/20/23 11:17 AM   Specimen: Anterior Nasal Swab  Result Value Ref Range Status   SARS Coronavirus 2 by RT PCR NEGATIVE NEGATIVE Final    Comment: (NOTE) SARS-CoV-2 target nucleic acids are NOT DETECTED.  The SARS-CoV-2 RNA is generally detectable in upper respiratory specimens during the acute phase of infection. The lowest concentration of SARS-CoV-2 viral copies this assay can detect is 138 copies/mL. A negative result  does not preclude SARS-Cov-2 infection and should not be used as the sole basis for treatment or other patient management decisions. A negative result may occur with  improper specimen collection/handling, submission of specimen other than nasopharyngeal swab, presence of viral mutation(s) within the areas targeted by this assay, and inadequate number of viral copies(<138 copies/mL). A negative result must be combined with clinical observations, patient history, and epidemiological information. The expected result is Negative.  Fact Sheet for Patients:  BloggerCourse.com  Fact Sheet for Healthcare Providers:  SeriousBroker.it  This test is no t yet approved or cleared by the [place]  FDA and  has been authorized for detection and/or diagnosis of SARS-CoV-2 by FDA under an Emergency Use Authorization (EUA). This EUA will remain  in effect (meaning this test can be used) for the duration of the COVID-19 declaration under Section 564(b)(1) of the Act, 21 U.S.C.section 360bbb-3(b)(1), unless the authorization is terminated  or revoked sooner.  Influenza A by PCR NEGATIVE NEGATIVE Final   Influenza B by PCR NEGATIVE NEGATIVE Final    Comment: (NOTE) The Xpert Xpress SARS-CoV-2/FLU/RSV plus assay is intended as an aid in the diagnosis of influenza from Nasopharyngeal swab specimens and should not be used as a sole basis for treatment. Nasal washings and aspirates are unacceptable for Xpert Xpress SARS-CoV-2/FLU/RSV testing.  Fact Sheet for Patients: BloggerCourse.com  Fact Sheet for Healthcare Providers: SeriousBroker.it  This test is not yet approved or cleared by the [place]  FDA and has been authorized for detection and/or diagnosis of SARS-CoV-2 by FDA under an Emergency Use Authorization (EUA). This EUA will remain in effect (meaning this test can be used) for  the duration of the COVID-19 declaration under Section 564(b)(1) of the Act, 21 U.S.C. section 360bbb-3(b)(1), unless the authorization is terminated or revoked.     Resp Syncytial Virus by PCR NEGATIVE NEGATIVE Final    Comment: (NOTE) Fact Sheet for Patients: BloggerCourse.com  Fact Sheet for Healthcare Providers: SeriousBroker.it  This test is not yet approved or cleared by the [place]  FDA and has been authorized for detection and/or diagnosis of SARS-CoV-2 by FDA under an Emergency Use Authorization (EUA). This EUA will remain in effect (meaning this test can be used) for the duration of the COVID-19 declaration under Section 564(b)(1) of the Act, 21 U.S.C. section 360bbb-3(b)(1), unless the authorization is terminated or revoked.  Performed at [facility], [address] [place], [place] [address]   Blood Culture (routine x 2)     Status: None (Preliminary result)   Collection Time: 07/20/23 11:48 AM   Specimen: BLOOD  Result Value Ref Range Status   Specimen Description BLOOD BLOOD LEFT ARM  Final   Special Requests   Final    BOTTLES DRAWN AEROBIC AND ANAEROBIC Blood Culture results may not be optimal due to an inadequate volume of blood received in culture bottles   Culture   Final    NO GROWTH 3 DAYS Performed at [facility], [address] [place], [place] [address]    Report Status PENDING  Incomplete  Blood Culture (routine x 2)     Status: None (Preliminary result)   Collection Time: 07/20/23 12:03 PM   Specimen: BLOOD  Result Value Ref Range Status   Specimen Description BLOOD BLOOD RIGHT HAND  Final   Special Requests   Final    BOTTLES DRAWN AEROBIC ONLY Blood Culture results may not be optimal due to an inadequate volume of blood received in culture bottles   Culture   Final    NO GROWTH 3 DAYS Performed at [facility], [address], [place], [place] [address]     Report Status PENDING  Incomplete  MRSA Next Gen by PCR, Nasal     Status: None   Collection Time: 07/20/23  3:29 PM   Specimen: Nasal Mucosa; Nasal Swab  Result Value Ref Range Status   MRSA by PCR Next Gen NOT DETECTED NOT DETECTED Final    Comment: (NOTE) The GeneXpert MRSA Assay (FDA approved for NASAL specimens only), is one component of a comprehensive MRSA colonization surveillance program. It is not intended to diagnose MRSA infection nor to guide or monitor treatment for MRSA infections. Test performance is not FDA approved in patients less than 27 years old. Performed at [facility], [address] [place], [place] [address]     Labs: CBC: Recent Labs  Lab 07/20/23 [phone] 07/21/23 [phone] 07/22/23 [id number]  WBC 16.0* 26.2* 16.9*  NEUTROABS 14.6*  --   --   HGB 8.9* 8.8* 7.3*  HCT 27.8* 27.1* 22.0*  MCV 91.1 85.5 83.0  PLT 325 244 249   Basic Metabolic Panel: Recent Labs  Lab 07/20/23 1148 07/21/23 0409 07/22/23 0429  NA 139 140 142  K 5.8* 5.5* 4.5  CL 108 109 109  CO2 23 21* 23  GLUCOSE 88 117* 92  BUN 47* 45* 51*  CREATININE 1.92* 1.84* 1.87*  CALCIUM 8.8* 8.6* 9.1   Liver Function Tests: Recent Labs  Lab 07/20/23 1148  AST 37  ALT 24  ALKPHOS 49  BILITOT 1.0  PROT 6.8  ALBUMIN 2.8*   CBG: Recent Labs  Lab 07/20/23 1522  GLUCAP 85    Discharge time spent: greater than 30 minutes.  This record has been created using Conservation officer, historic buildings. Errors have been sought and corrected,but may not always be located. Such creation errors do not reflect on the standard of care.   Signed: Luna Salinas, MD [facility] 07/23/2023

## 2023-07-25 LAB — CULTURE, BLOOD (ROUTINE X 2)
Culture: NO GROWTH
Culture: NO GROWTH

## 2023-08-06 ENCOUNTER — Emergency Department
Admission: EM | Admit: 2023-08-06 | Discharge: 2023-08-20 | Disposition: E | Attending: Emergency Medicine | Admitting: Emergency Medicine

## 2023-08-06 ENCOUNTER — Other Ambulatory Visit: Payer: Self-pay

## 2023-08-06 DIAGNOSIS — Z66 Do not resuscitate: Secondary | ICD-10-CM | POA: Diagnosis not present

## 2023-08-06 DIAGNOSIS — I469 Cardiac arrest, cause unspecified: Secondary | ICD-10-CM | POA: Insufficient documentation

## 2023-08-06 MED ORDER — MORPHINE SULFATE (PF) 2 MG/ML IV SOLN
2.0000 mg | Freq: Once | INTRAVENOUS | Status: AC
  Administered 2023-08-06: 2 mg via INTRAVENOUS
  Filled 2023-08-06: qty 1

## 2023-08-06 MED ORDER — MORPHINE SULFATE (PF) 2 MG/ML IV SOLN
2.0000 mg | INTRAVENOUS | Status: DC | PRN
  Administered 2023-08-06: 2 mg via INTRAVENOUS
  Filled 2023-08-06: qty 1

## 2023-08-06 NOTE — ED Notes (Signed)
 This RN made contact with patient's Hospice team and spoke with Shelvy Dickens, triage 24/7 number is [phone]. Per Shelvy Dickens this number is 24/7 and can be contacted with any status updates or needs. Shelvy Dickens is notifying patient's primary Hospice team and reported a nurse may come by to assess patient while she is here. This RN notified primary RN Boyd Cabal and MD Quale at this time.

## 2023-08-06 NOTE — ED Notes (Signed)
 Attempted to obtain a BP on the patient. Could not get a reading at this time.

## 2023-08-06 NOTE — ED Notes (Signed)
 Patient's hospice team notified of patient's time of death. Spoke to [place].

## 2023-08-06 NOTE — ED Notes (Signed)
 Funeral Home Release form filled out and kept with patient's files.

## 2023-08-06 NOTE — ED Notes (Signed)
 Patient's family at bedside. Patient remains on monitor. Patient appears to be comfortable at this time. Patient's family denies any needs at this time.

## 2023-08-06 NOTE — ED Provider Notes (Signed)
 Notifed by RNs that patient asytolic, deceased.    Iver Marker, MD 17-Aug-2023 2224

## 2023-08-06 NOTE — ED Provider Notes (Signed)
 [facility] Provider Note    Event Date/Time   First MD Initiated Contact with Patient 08/17/2023 1902     (approximate)   History   Cardiac Arrest  EM caveat: Cardiac arrest  HPI  Kelly Pugh is a 83 y.o. female     EMS reports the patient was last seen normal about 1 hour prior to the arrival was found unresponsive by family.  EMS noted the patient was initially in asystole, but after a few minutes of CPR had return of circulation then with bradycardia and agonal pulses, never any shockable rhythm.  She has had weak but present carotid pulse on arrival to the ER.  Airway managed with i-gel with positive end-tidal CO2 and waveform  EMS advised the patient is under hospice care, and no immediate DNR form once available.  They do however express that family on scene expressed possible DNR (no form was physically available however at time of emergency thus CPR performed by EMS)      Physical Exam   Triage Vital Signs: ED Triage Vitals  Encounter Vitals Group     BP      Girls Systolic BP Percentile      Girls Diastolic BP Percentile      Boys Systolic BP Percentile      Boys Diastolic BP Percentile      Pulse      Resp      Temp      Temp src      SpO2      Weight      Height      Head Circumference      Peak Flow      Pain Score      Pain Loc      Pain Education      Exclude from Growth Chart     Most recent vital signs: Vitals:   08/10/2023 1900 07/31/2023 2111  BP: (!) 120/52   Pulse: (!) 45 (!) 43  Resp: 10 (!) 8  SpO2: 100% (!) 75%     General: Nonresponsive.  I-gel in place with end-tidal waveform and capnography approximately 35. CV:  Weak peripheral perfusion but positive and bradycardic carotid pulse Resp:  Assisted via bag-valve-mask ventilation and i-gel.  Rhonchorous lung sounds bilaterally with bagging Abd:  No distention.  Other:  No noted spontaneous movement    Upon extubation of the Igel the patient was  noted to have mild gag reflex and diminished and irregular breathing pattern however she does not remain with any responsiveness to stimuli or conversation.  The patient appears to have spontaneous respiration, though they are deemed to be weak.   ----------------------------------------- 8:38 PM on 08/11/2023 ----------------------------------------- Patient respirating, nonverbal nonconversant, but is still having irregular pattern spontaneous respirations.  Family at bedside, the patient does not appear in distress or severe pain.  Morphine given for air hunger   ED Results / Procedures / Treatments   Labs (all labs ordered are listed, but only abnormal results are displayed) Labs Reviewed - No data to display   EKG     RADIOLOGY     PROCEDURES:  Critical Care performed: No  Procedures   MEDICATIONS ORDERED IN ED: Medications  morphine (PF) 2 MG/ML injection 2 mg (2 mg Intravenous Given 08/16/2023 2107)  morphine (PF) 2 MG/ML injection 2 mg (2 mg Intravenous Given 08/11/2023 1908)     IMPRESSION / MDM / ASSESSMENT AND PLAN / ED COURSE  I  reviewed the triage vital signs and the nursing notes.                              Differential diagnosis includes, but is not limited to, cardiac arrest, dysrhythmia, electrode abnormality severe sepsis shock pulmonary embolism etc. the differential diagnosis been extremely broad.  However after discussing with the patient's daughter via phone about 5 minutes after the patient arrived, daughter expresses the patient is under hospice care.  We delineated goals of care and daughter, given that, the notes the patient is intended to be DO NOT RESUSCITATE.  We discussed and we will extubate the EyeGel, and withhold any further resuscitative measures.  Progressing towards comfort measures based on discussion with patient and family.  Also reviewed previous admission and patient's documentation is quite clear of a DNR status at that time as  well  Patient's presentation is most consistent with acute presentation with potential threat to life or bodily function.   The patient is on the cardiac monitor to evaluate for evidence of arrhythmia and/or significant heart rate changes.   Clinical Course as of 08/08/2023 Sep 04, 2226  Tue Aug 06, 2023  2033 Patient's family including daughter is at the bed [name] died.  Updated [MQ]  September 04, 2031 Patient's daughter also brought with her a DNR.  Expresses wishes for comfort care.  [facility] [MQ]    Clinical Course User Index [MQ] Iver Marker, MD   ----------------------------------------- 8:34 PM on 08/09/2023 ----------------------------------------- The patient's daughter whom I spoke to earlier on the phone, is now at the bedside.  She advises that patient lives with her, she makes her medical decisions.  At this point, the patient does have a valid DNR presented with her now as well.  The patient has comfort measures only.  No further blood testing ECGs etc. to be performed.  Anticipate the patient is in end-of-life care and this is agreeable with the patient's family also at the bedside.   ----------------------------------------- 10:27 PM on 07/24/2023 ----------------------------------------- Patient nonresponsive no respirations, asystolic.  The patient has expired.  Family at the bedside advises primary care is [name] at [facility].  FINAL CLINICAL IMPRESSION(S) / ED DIAGNOSES   Final diagnoses:  Cardiac arrest [facility])  Death     Rx / DC Orders   ED Discharge Orders     None        Note:  This document was prepared using Dragon voice recognition software and may include unintentional dictation errors.   Iver Marker, MD 07/29/2023 09/04/26

## 2023-08-06 NOTE — ED Triage Notes (Signed)
 Pt comes in via [facility] after having 2 episodes of cardiac arrest. Pt received 4 rounds of CPR, and received 4 total epi's. DNR wasn't produced by family. Patient currently on home hospice. MD Quale at bedside at this time. Speaking with patient's daughter about Tx plan.

## 2023-08-06 NOTE — ED Notes (Signed)
 Spoke with patient's daughter. Patient will be made comfort care at this time.

## 2023-08-07 MED FILL — Medication: Qty: 1 | Status: AC

## 2023-08-20 DIAGNOSIS — 419620001 Death: Secondary | SNOMED CT

## 2023-08-20 DEATH — deceased
